# Patient Record
Sex: Female | Born: 2000 | Hispanic: No | Marital: Single | Smoking: Never smoker
Health system: Southern US, Community
[De-identification: ages and names within clinical notes are randomized; demographics above are authoritative.]

## PROBLEM LIST (undated history)

## (undated) DIAGNOSIS — E039 Hypothyroidism, unspecified: Secondary | ICD-10-CM

## (undated) DIAGNOSIS — R634 Abnormal weight loss: Secondary | ICD-10-CM

## (undated) HISTORY — PX: ARTERY AND TENDON REPAIR: SHX5696

## (undated) HISTORY — PX: MULTIPLE TOOTH EXTRACTIONS: SHX2053

## (undated) HISTORY — DX: Hypothyroidism, unspecified: E03.9

## (undated) HISTORY — DX: Abnormal weight loss: R63.4

---
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
## 2020-01-30 ENCOUNTER — Ambulatory Visit: Payer: No Typology Code available for payment source | Admitting: Family Medicine

## 2020-05-28 ENCOUNTER — Telehealth: Payer: Self-pay | Admitting: Family Medicine

## 2020-05-28 NOTE — Telephone Encounter (Signed)
Left at desk- mom notified.

## 2020-05-28 NOTE — Telephone Encounter (Signed)
Pt is requesting copy of shot record.  °

## 2020-10-06 ENCOUNTER — Ambulatory Visit: Payer: No Typology Code available for payment source | Admitting: Family Medicine

## 2020-10-20 ENCOUNTER — Other Ambulatory Visit (INDEPENDENT_AMBULATORY_CARE_PROVIDER_SITE_OTHER): Payer: BLUE CROSS/BLUE SHIELD

## 2020-10-20 ENCOUNTER — Other Ambulatory Visit: Payer: Self-pay

## 2020-10-20 DIAGNOSIS — Z111 Encounter for screening for respiratory tuberculosis: Secondary | ICD-10-CM | POA: Diagnosis not present

## 2020-10-20 NOTE — Progress Notes (Signed)
ERROR

## 2020-10-22 ENCOUNTER — Ambulatory Visit (HOSPITAL_COMMUNITY)
Admission: RE | Admit: 2020-10-22 | Discharge: 2020-10-22 | Disposition: A | Discharge status: Home / Self Care | Payer: BLUE CROSS/BLUE SHIELD | Source: Ambulatory Visit | Attending: Family Medicine | Admitting: Family Medicine

## 2020-10-22 ENCOUNTER — Other Ambulatory Visit: Payer: Self-pay

## 2020-10-22 ENCOUNTER — Other Ambulatory Visit: Payer: Self-pay | Admitting: Family Medicine

## 2020-10-22 DIAGNOSIS — R7611 Nonspecific reaction to tuberculin skin test without active tuberculosis: Secondary | ICD-10-CM | POA: Diagnosis not present

## 2020-10-22 DIAGNOSIS — Z111 Encounter for screening for respiratory tuberculosis: Secondary | ICD-10-CM | POA: Insufficient documentation

## 2020-10-22 DIAGNOSIS — Q676 Pectus excavatum: Secondary | ICD-10-CM | POA: Diagnosis not present

## 2020-10-22 LAB — TB SKIN TEST: TB Skin Test: POSITIVE

## 2020-10-23 ENCOUNTER — Other Ambulatory Visit: Payer: Self-pay | Admitting: Family Medicine

## 2020-10-23 DIAGNOSIS — Z111 Encounter for screening for respiratory tuberculosis: Secondary | ICD-10-CM | POA: Diagnosis not present

## 2020-10-25 LAB — QUANTIFERON-TB GOLD PLUS

## 2020-10-26 ENCOUNTER — Telehealth: Payer: Self-pay

## 2020-10-26 NOTE — Telephone Encounter (Signed)
Results not completed at this time. Contacted patient and pt verbalized understanding.

## 2020-10-26 NOTE — Telephone Encounter (Signed)
Patient would like results of TB lab results.

## 2020-10-27 LAB — QUANTIFERON-TB GOLD PLUS
QuantiFERON Mitogen Value: >10 IU/mL
QuantiFERON Nil Value: 0 IU/mL
QuantiFERON TB1 Ag Value: 0 IU/mL
QuantiFERON TB2 Ag Value: 0 IU/mL
QuantiFERON-TB Gold Plus: NEGATIVE

## 2021-02-24 ENCOUNTER — Telehealth: Payer: Self-pay

## 2021-02-24 NOTE — Telephone Encounter (Signed)
Need copy of shot record and copy of test results on TB after they did the chest x-Ray   Pt call back [phone]

## 2021-02-25 NOTE — Telephone Encounter (Signed)
Vaccine record and blood work up front for pick up. Patient notified.

## 2021-03-10 ENCOUNTER — Ambulatory Visit
Admission: EM | Admit: 2021-03-10 | Discharge: 2021-03-10 | Disposition: A | Discharge status: Home / Self Care | Payer: 59 | Attending: Emergency Medicine | Admitting: Emergency Medicine

## 2021-03-10 ENCOUNTER — Other Ambulatory Visit: Payer: Self-pay

## 2021-03-10 ENCOUNTER — Encounter: Payer: Self-pay | Admitting: Emergency Medicine

## 2021-03-10 DIAGNOSIS — R509 Fever, unspecified: Secondary | ICD-10-CM | POA: Diagnosis present

## 2021-03-10 DIAGNOSIS — J029 Acute pharyngitis, unspecified: Secondary | ICD-10-CM | POA: Diagnosis present

## 2021-03-10 LAB — POCT MONO SCREEN (KUC): Mono, POC: NEGATIVE

## 2021-03-10 LAB — POCT RAPID STREP A (OFFICE): Rapid Strep A Screen: NEGATIVE

## 2021-03-10 MED ORDER — IBUPROFEN 800 MG PO TABS
800.0000 mg | ORAL_TABLET | Freq: Once | ORAL | Status: AC
  Administered 2021-03-10: 800 mg via ORAL

## 2021-03-10 MED ORDER — IBUPROFEN 800 MG PO TABS: 800.0000 mg | ORAL_TABLET | Freq: Three times a day (TID) | ORAL | 0 refills | Status: DC

## 2021-03-10 NOTE — ED Provider Notes (Signed)
[facility]   [id number] 03/10/21 Arrival Time: 1711  XK:GYJE THROAT  SUBJECTIVE: History from: patient.  Wendy Lester is a 20 y.o. female who presents with abrupt onset of fever, and sore throat x few days.  Denies sick exposure to strep, flu or mono, or precipitating event.  Has tried OTC medications without relief.  Symptoms are made worse with swallowing, but tolerating liquids and own secretions without difficulty.  However, has not been drinking liquids due to discomfort.  Denies previous symptoms in the past.  Denies ear pain, sinus pain, rhinorrhea, nasal congestion, cough, SOB, wheezing, chest pain, nausea, rash, changes in bowel or bladder habits.     ROS: As per HPI.  All other pertinent ROS negative.     History reviewed. No pertinent past medical history. Past Surgical History:  Procedure Laterality Date  . ARTERY AND TENDON REPAIR Left    Radial artery repair  . ARTERY EXPLORATION Left 05/23/2016   Procedure: EXPLORATION LEFT WRIST with REPAIR OF Radial ARTERY;  Surgeon: Sherren Kerns, MD;  Location: [facility] OR;  Service: Vascular;  Laterality: Left;  Marland Kitchen MULTIPLE TOOTH EXTRACTIONS    . TENDON REPAIR Left 05/23/2016   Procedure: Flexor Carpi RadialisTENDON REPAIR;  Surgeon: Sherren Kerns, MD;  Location: [facility] OR;  Service: Vascular;  Laterality: Left;   Allergies  Allergen Reactions  . Pork-Derived Products    No current facility-administered medications on file prior to encounter.   Current Outpatient Medications on File Prior to Encounter  Medication Sig Dispense Refill  . famotidine (PEPCID) 20 MG tablet Take one tablet po every morning 30 tablet 5  . fluocinonide cream (LIDEX) 0.05 % Apply 1 application topically 2 (two) times daily as needed (eczema).    . promethazine (PHENERGAN) 25 MG tablet Take one tablet po q 8 hours prn nausea 24 tablet 2  . [DISCONTINUED] cetirizine (ZYRTEC CHILDRENS ALLERGY) 10 MG chewable tablet Chew 1 tablet (10 mg total) by  mouth at bedtime. (Patient not taking: Reported on 08/27/2019) 30 tablet 5   Social History   Socioeconomic History  . Marital status: Single    Spouse name: Not on file  . Number of children: Not on file  . Years of education: Not on file  . Highest education level: Not on file  Occupational History  . Not on file  Tobacco Use  . Smoking status: Never Smoker  . Smokeless tobacco: Never Used  Substance and Sexual Activity  . Alcohol use: Not on file  . Drug use: Not on file  . Sexual activity: Never  Other Topics Concern  . Not on file  Social History Narrative   Lives with Mom, Dad, & Brother. 2 cats, 2 baby birds. No smokers.   Social Determinants of Health   Financial Resource Strain: Not on file  Food Insecurity: Not on file  Transportation Needs: Not on file  Physical Activity: Not on file  Stress: Not on file  Social Connections: Not on file  Intimate Partner Violence: Not on file   Family History  Problem Relation Age of Onset  . Diabetes Father   . Leukemia Brother     OBJECTIVE:  Vitals:   03/10/21 1805 03/10/21 1807 03/10/21 1841  BP:  (!) 88/59 119/78  Pulse:  (!) 122 (!) 113  Resp:  18   Temp:  (!) 100.4 F (38 C)   TempSrc:  Oral   SpO2:  98%   Weight: 108 lb (49 kg)  General appearance: alert; appears fatigued, but nontoxic, speaking in full sentences and managing own secretions HEENT: NCAT; Ears: EACs clear, TMs pearly gray with visible cone of light, without erythema; Eyes: PERRL, EOMI grossly; Nose: no obvious rhinorrhea; Throat: oropharynx clear, tonsils 1+ and mildly erythematous without white tonsillar exudates, blister to soft palate; Lungs: CTA bilaterally without adventitious breath sounds; cough absent Heart: regular rate and rhythm.  Radial pulses 2+ symmetrical bilaterally Skin: warm and dry Psychological: alert and cooperative; normal mood and affect  LABS: Results for orders placed or performed during the hospital encounter  of 03/10/21 (from the past 24 hour(s))  POCT rapid strep A     Status: None   Collection Time: 03/10/21  6:14 PM  Result Value Ref Range   Rapid Strep A Screen Negative Negative  POCT mono screen     Status: None   Collection Time: 03/10/21  6:35 PM  Result Value Ref Range   Mono, POC Negative Negative     ASSESSMENT & PLAN:  1. Sore throat   2. Fever, unspecified     Meds ordered this encounter  Medications  . ibuprofen (ADVIL) tablet 800 mg  . ibuprofen (ADVIL) 800 MG tablet    Sig: Take 1 tablet (800 mg total) by mouth 3 (three) times daily.    Dispense:  21 tablet    Refill:  0    Order Specific Question:   Supervising Provider    Answer:   Eustace Moore [[id number]]   Vital signs improved with ibuprofen and po fluids Strep test negative, will send out for culture and we will call you with results Mono negative Get plenty of rest and push fluids Supplement with pedialyte Drink warm or cool liquids, use throat lozenges, or popsicles to help alleviate symptoms Ibuprofen prescribed.  Take as directed Follow up with pediatrician for recheck Return or go to ER if patient has any new or worsening symptoms such as fever, chills, nausea, vomiting, worsening sore throat, cough, abdominal pain, chest pain, changes in bowel or bladder habits, etc...  Reviewed expectations re: course of current medical issues. Questions answered. Outlined signs and symptoms indicating need for more acute intervention. Patient verbalized understanding. After Visit Summary given.        Rennis Harding, PA-C 03/10/21 [id number]

## 2021-03-10 NOTE — Discharge Instructions (Signed)
Vital signs improved with ibuprofen and po fluids Strep test negative, will send out for culture and we will call you with results Mono negative Get plenty of rest and push fluids Supplement with pedialyte Drink warm or cool liquids, use throat lozenges, or popsicles to help alleviate symptoms Ibuprofen prescribed.  Take as directed Follow up with pediatrician for recheck Return or go to ER if patient has any new or worsening symptoms such as fever, chills, nausea, vomiting, worsening sore throat, cough, abdominal pain, chest pain, changes in bowel or bladder habits, etc..Marland Kitchen

## 2021-03-10 NOTE — ED Triage Notes (Signed)
Fever, sore throat, headache, dizzy for the past few days.  Had one tylenol tablet at 3pm today.

## 2021-03-12 LAB — COVID-19, FLU A+B NAA
Influenza A, NAA: NOT DETECTED
Influenza B, NAA: NOT DETECTED
SARS-CoV-2, NAA: NOT DETECTED

## 2021-03-13 LAB — CULTURE, GROUP A STREP (THRC)

## 2021-04-20 IMAGING — DX DG CHEST 2V
2 series · 2 of 2 positions shown · non-contrast
Comparison: Chest x-ray dated October 07, 2008.

CLINICAL DATA: Positive TB skin test.  No chest complaints.

EXAM:
CHEST - 2 VIEW

[chest pa]
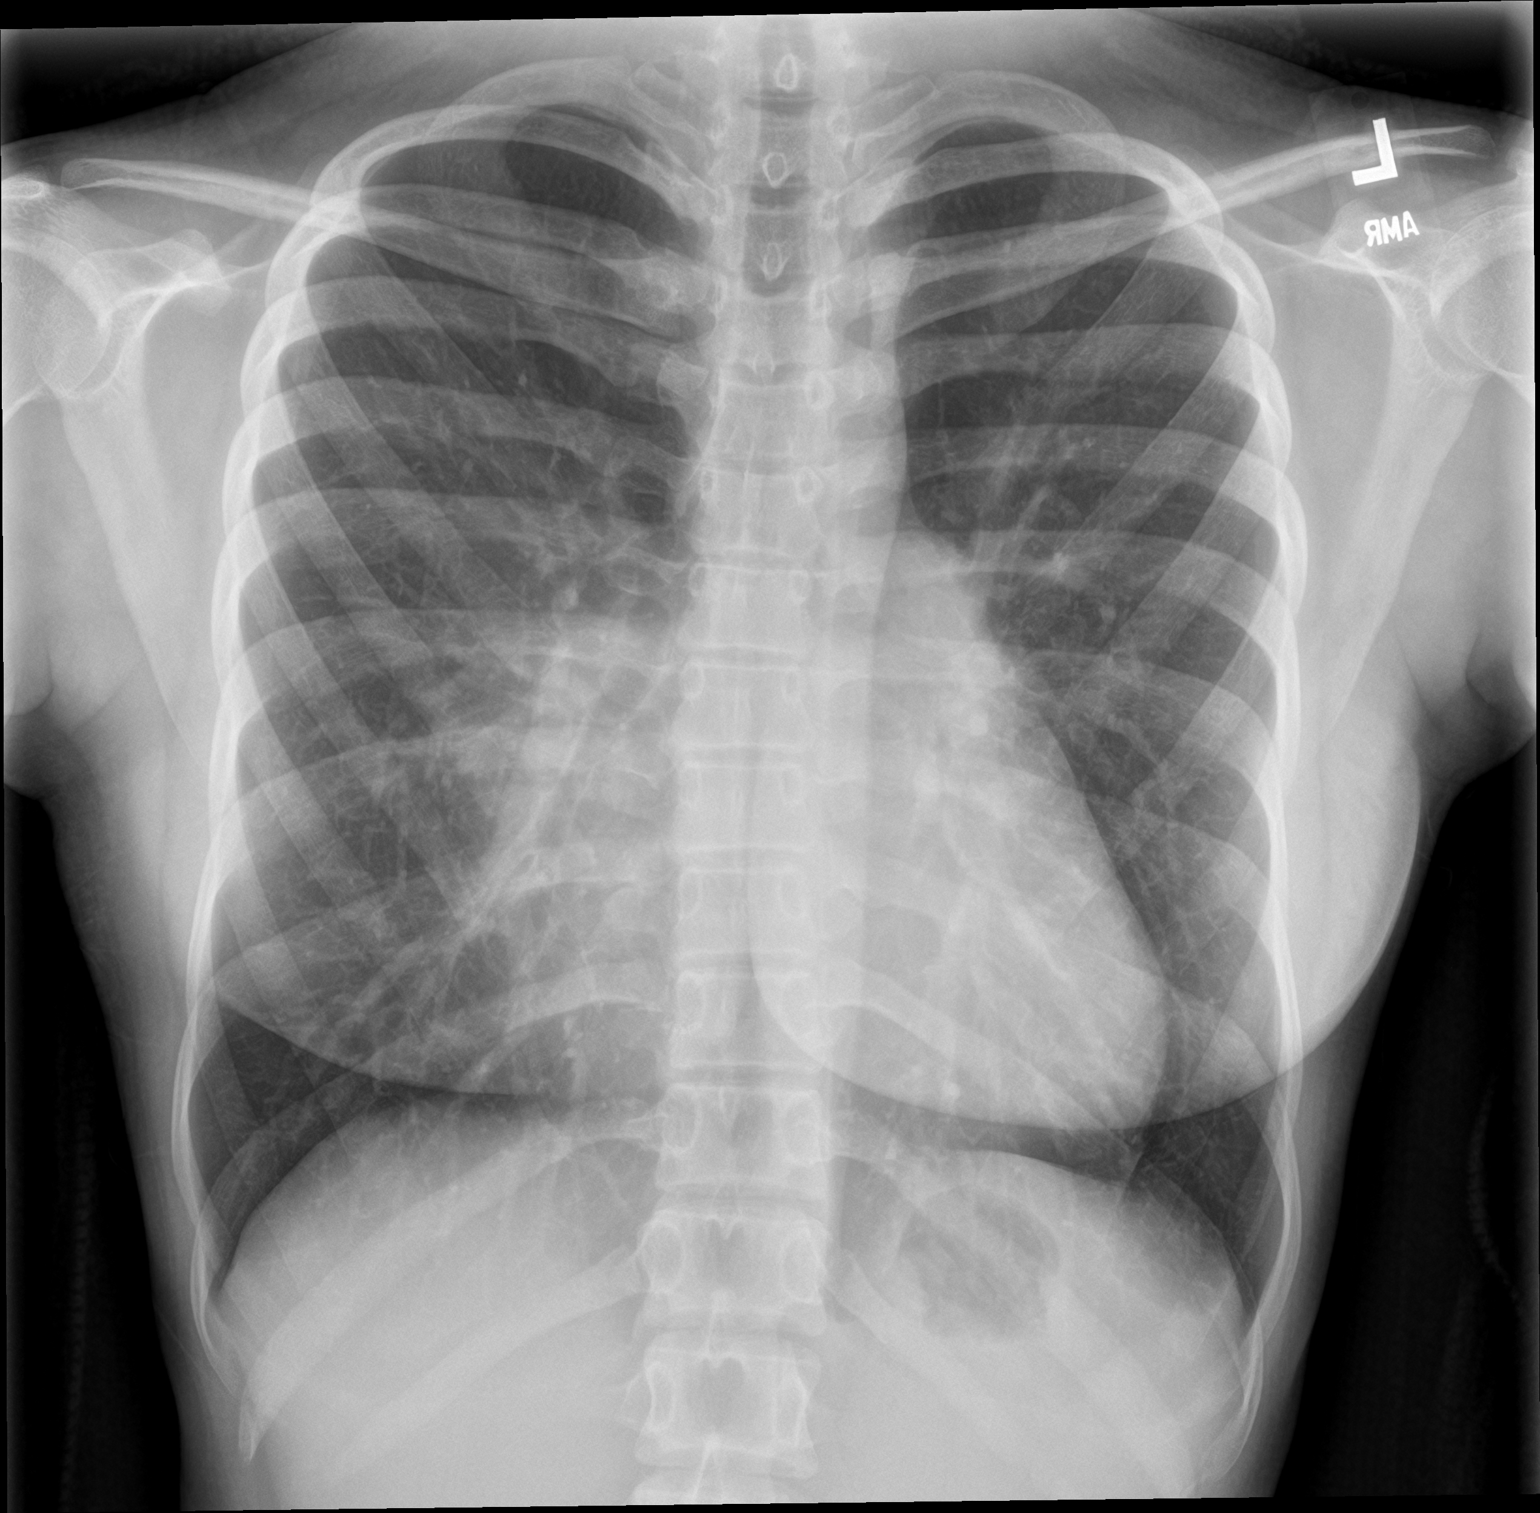

[chest lat]
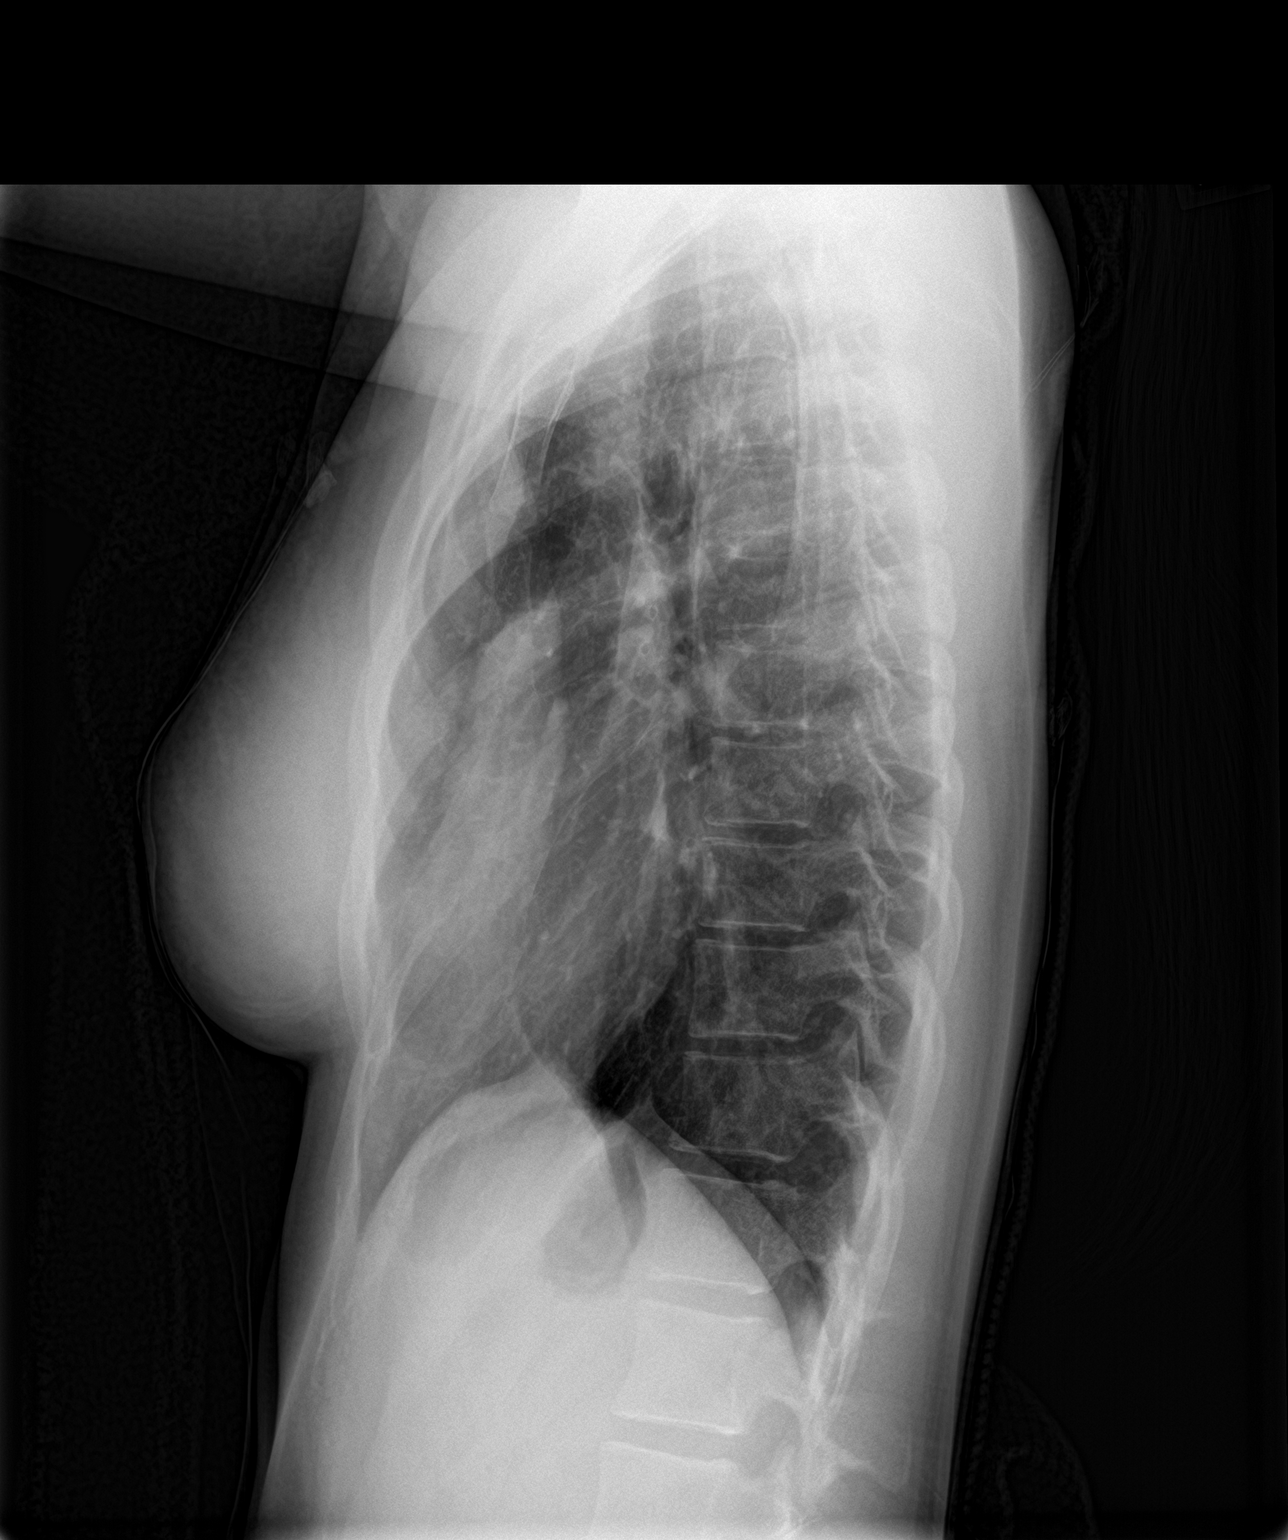

[2 of 2 positions shown; findings below may reference images not displayed]

FINDINGS: Slightly accentuated cardiac silhouette with blurring of the right
heart border related to pectus excavatum deformity. Normal pulmonary
vascularity. No focal consolidation, pleural effusion, or
pneumothorax. No acute osseous abnormality.
IMPRESSION: 1. No acute cardiopulmonary disease.
2. Pectus excavatum.

## 2021-04-21 ENCOUNTER — Telehealth: Payer: Self-pay | Admitting: Family Medicine

## 2021-04-21 NOTE — Telephone Encounter (Signed)
Patient is requesting copy of shot records and TB test results.

## 2021-04-22 NOTE — Telephone Encounter (Signed)
Patient informed shot record and tb test results are at front desk for pick up

## 2021-05-27 ENCOUNTER — Other Ambulatory Visit: Payer: Self-pay

## 2021-05-27 ENCOUNTER — Ambulatory Visit (INDEPENDENT_AMBULATORY_CARE_PROVIDER_SITE_OTHER): Payer: 59 | Admitting: Family Medicine

## 2021-05-27 ENCOUNTER — Ambulatory Visit: Payer: Self-pay | Admitting: Family Medicine

## 2021-05-27 ENCOUNTER — Encounter: Payer: Self-pay | Admitting: Family Medicine

## 2021-05-27 VITALS — BP 94/64 | HR 84 | Temp 98.1°F | Ht 64.58 in | Wt 104.0 lb

## 2021-05-27 DIAGNOSIS — R634 Abnormal weight loss: Secondary | ICD-10-CM | POA: Diagnosis not present

## 2021-05-27 DIAGNOSIS — R636 Underweight: Secondary | ICD-10-CM | POA: Diagnosis not present

## 2021-05-27 DIAGNOSIS — R5383 Other fatigue: Secondary | ICD-10-CM

## 2021-05-27 NOTE — Patient Instructions (Signed)
https://www.dietaryguidelines.gov/sites/default/files/2021-03/Dietary_Guidelines_for_Americans-2020-2025.pdf">  High-Protein and High-Calorie Diet Eating high-protein and high-calorie foods can help you to gain weight, heal after an injury, and recover after an illness or surgery. The specific amount of daily protein and calories you need depends on: Your body weight. The reason this diet is recommended for you. Generally, a high-protein, high-calorie diet involves: Eating 250-500 extra calories each day. Making sure that you get enough of your daily calories from protein. Ask your health care provider how many of your calories should come from protein. Talk with a health care provider or a dietitian about how much protein and how many calories you need each day. Follow the diet as directed by your healthcare provider. What are tips for following this plan?  Reading food labels Check the nutrition facts label for calories, grams of fat and protein. Items with more than 4 grams of protein are high-protein foods. Preparing meals Add whole milk, half-and-half, or heavy cream to cereal, pudding, soup, or hot cocoa. Add whole milk to instant breakfast drinks. Add peanut butter to oatmeal or smoothies. Add powdered milk to baked goods, smoothies, or milkshakes. Add powdered milk, cream, or butter to mashed potatoes. Add cheese to cooked vegetables. Make whole-milk yogurt parfaits. Top them with granola, fruit, or nuts. Add cottage cheese to fruit. Add avocado, cheese, or both to sandwiches or salads. Add avocado to smoothies. Add meat, poultry, or seafood to rice, pasta, casseroles, salads, and soups. Use mayonnaise when making egg salad, chicken salad, or tuna salad. Use peanut butter as a dip for fruits and vegetables or as a topping for pretzels, celery, or crackers. Add beans to casseroles, dips, and spreads. Add pureed beans to sauces and soups. Replace calorie-free drinks with  calorie-containing drinks, such as milk and fruit juice. Replace water with milk or heavy cream when making foods such as oatmeal, pudding, or cocoa. Add oil or butter to cooked vegetables and grains. Add cream cheese to sandwiches or as a topping on crackers and bread. Make cream-based pastas and soups. General information Ask your health care provider if you should take a nutritional supplement. Try to eat six small meals each day instead of three large meals. A general goal is to eat every 2 to 3 hours. Eat a balanced diet. In each meal, include one food that is high in protein and one food with fat in it. Keep nutritious snacks available, such as nuts, trail mixes, dried fruit, and yogurt. If you have kidney disease or diabetes, talk with your health care provider about how much protein is safe for you. Too much protein may put extra stress on your kidneys. Drink your calories. Choose high-calorie drinks and have them after your meals. Consider setting a timer to remind you to eat. You will want to eat even if you do not feel very hungry. What high-protein foods should I eat?  Vegetables Soybeans. Peas. Grains Quinoa. Bulgur wheat. Buckwheat. Meats and other proteins Beef, pork, and poultry. Fish and seafood. Eggs. Tofu. Textured vegetable protein (TVP). Peanut butter. Nuts and seeds. Dried beans. Protein powders.Hummus. Dairy Whole milk. Whole-milk yogurt. Powdered milk. Cheese. Cottage Cheese. Eggnog. Beverages High-protein supplement drinks. Soy milk. Other foods Protein bars. The items listed above may not be a complete list of foods and beverages you can eat and drink. Contact a dietitian for more information. What high-calorie foods should I eat? Fruits Dried fruit. Fruit leather. Canned fruit in syrup. Fruit juice. Avocado. Vegetables Vegetables cooked in oil or butter. Fried potatoes. Grains   Pasta. Quick breads. Muffins. Pancakes. Ready-to-eat cereal. Meats and other  proteins Peanut butter. Nuts and seeds. Dairy Heavy cream. Whipped cream. Cream cheese. Sour cream. Ice cream. Custard.Pudding. Whole milk dairy products. Beverages Meal-replacement beverages. Nutrition shakes. Fruit juice. Seasonings and condiments Salad dressing. Mayonnaise. Alfredo sauce. Fruit preserves or jelly. Honey.Syrup. Sweets and desserts Cake. Cookies. Pie. Pastries. Candy bars. Chocolate. Fats and oils Butter or margarine. Oil. Gravy. Other foods Meal-replacement bars. The items listed above may not be a complete list of foods and beverages you can eat and drink. Contact a dietitian for more information. Summary A high-protein, high-calorie diet can help you gain weight or heal faster after an injury, illness, or surgery. To increase your protein and calories, add ingredients such as whole milk, peanut butter, cheese, beans, meat, or seafood to meal items. To get enough extra calories each day, include high-calorie foods and beverages at each meal. Adding a high-calorie drink or shake can be an easy way to help you get enough calories each day. Talk with your healthcare provider or dietitian about the best options for you. This information is not intended to replace advice given to you by your health care provider. Make sure you discuss any questions you have with your healthcare provider. Document Revised: 10/11/2020 Document Reviewed: 10/11/2020 Elsevier Patient Education  2022 Elsevier Inc.  

## 2021-05-27 NOTE — Progress Notes (Signed)
Patient ID: Wendy Lester, female    DOB: Jul 18, 2001, 20 y.o.   MRN: [id number]   Chief Complaint  Patient presents with   appetite concerns    Mainly eats one meal per day and one snack Retest for possible tsh issue   Subjective:    HPI Pt seen for f/u for weight loss, seen in 2020 by last pcp for this.  Pt stating nothing new, but not eating a lot.  Not having an increase in weight.  Feeling should be around 125lb and bp is low and feeling tired/fatigued. Seen by last pcp in 12/20.  Gave some medication and labs and tried ot help improve the appetite. Pt never took the meds.  Tsh was borderline and asked them to f/u 60mofor recheck.  In college now.  Summer break an [employer].  Liking school.  [place] 4.7 in 12/20.  Pt stating she feels she is getting full really fast.  Feeling stomach is too full.  Eating 1 meal per day 1 snack per day.  Not feeling hungry in mornings, not eating till afternoons/dinner time.  No thyroid symptoms. No hair loss, coarse skin, diarrhea, constipation, palpations, having regular periods.  Exercising- no extra.  Wt Readings from Last 3 Encounters:  05/27/21 104 lb (47.2 kg) (7 %, Z= -1.47)*  03/10/21 108 lb (49 kg) (13 %, Z= -1.14)*  11/01/19 106 lb 3.2 oz (48.2 kg) (13 %, Z= -1.11)*   * Growth percentiles are based on CDC (Girls, 2-20 Years) data.    Medical History [place] no past medical history on file.   Outpatient Encounter Medications as of 05/27/2021  Medication Sig   [DISCONTINUED] cetirizine (ZYRTEC CHILDRENS ALLERGY) 10 MG chewable tablet Chew 1 tablet (10 mg total) by mouth at bedtime. (Patient not taking: Reported on 08/27/2019)   [DISCONTINUED] famotidine (PEPCID) 20 MG tablet Take one tablet po every morning (Patient not taking: Reported on 05/27/2021)   [DISCONTINUED] fluocinonide cream (LIDEX) 00.96% Apply 1 application topically 2 (two) times daily as needed (eczema). (Patient not  taking: Reported on 05/27/2021)   [DISCONTINUED] ibuprofen (ADVIL) 800 MG tablet Take 1 tablet (800 mg total) by mouth 3 (three) times daily. (Patient not taking: Reported on 05/27/2021)   [DISCONTINUED] promethazine (PHENERGAN) 25 MG tablet Take one tablet po q 8 hours prn nausea (Patient not taking: Reported on 05/27/2021)   No facility-administered encounter medications on file as of 05/27/2021.     Review of Systems  Constitutional:  Positive for appetite change, fatigue and unexpected weight change (weight loss). Negative for chills and fever.  HENT:  Negative for congestion, rhinorrhea and sore throat.   Respiratory:  Negative for cough, shortness of breath and wheezing.   Cardiovascular:  Negative for chest pain and leg swelling.  Gastrointestinal:  Negative for abdominal pain, diarrhea, nausea and vomiting.  Endocrine: Negative for cold intolerance, heat intolerance, polydipsia, polyphagia and polyuria.  Genitourinary:  Negative for dysuria and frequency.  Musculoskeletal:  Negative for arthralgias and back pain.  Skin:  Negative for rash.  Neurological:  Negative for dizziness, weakness and headaches.    Vitals BP 94/64   Pulse 84   Temp 98.1 F (36.7 C)   Ht 5' 4.58" (1.64 m)   Wt 104 lb (47.2 kg)   SpO2 98%   BMI 17.53 kg/m   Objective:   Physical Exam Vitals and nursing note reviewed.  Constitutional:      General: She  is not in acute distress.    Appearance: Normal appearance. She is not ill-appearing.  HENT:     Head: Normocephalic and atraumatic.     Nose: Nose normal. No congestion.     Mouth/Throat:     Mouth: Mucous membranes are moist.     Pharynx: Oropharynx is clear. No oropharyngeal exudate or posterior oropharyngeal erythema.  Eyes:     Extraocular Movements: Extraocular movements intact.     Conjunctiva/sclera: Conjunctivae normal.     Pupils: Pupils are equal, round, and reactive to light.  Cardiovascular:     Rate and Rhythm: Normal rate and regular  rhythm.     Pulses: Normal pulses.     Heart sounds: Normal heart sounds.  Pulmonary:     Effort: Pulmonary effort is normal.     Breath sounds: Normal breath sounds. No wheezing, rhonchi or rales.  Abdominal:     General: Abdomen is flat. Bowel sounds are normal. There is no distension.     Palpations: Abdomen is soft. There is no mass.     Tenderness: There is no abdominal tenderness. There is no guarding or rebound.     Hernia: No hernia is present.  Musculoskeletal:        General: Normal range of motion.     Cervical back: Normal range of motion.     Right lower leg: No edema.     Left lower leg: No edema.  Skin:    General: Skin is warm and dry.     Findings: No lesion or rash.  Neurological:     General: No focal deficit present.     Mental Status: She is alert and oriented to person, place, and time.     Cranial Nerves: No cranial nerve deficit.  Psychiatric:        Mood and Affect: Mood normal.        Behavior: Behavior normal.     Assessment and Plan   1. Other fatigue - CBC - CMP14+EGFR - Lipid panel - TSH  2. Weight loss - TSH  3. Patient underweight   Pt has BMI 17.53.  Pt denies any anxiety or stress.  No pain or nausea.  Likely not taking in enough calories with only eating 1 meal per day and 1 snack.  Gave handout on increaseing calories in diet, recommending getting at least 1200 cal per day.  And eating high calories/high fat items to help get weight up.   Results to home number.  Return in about 6 months (around 11/27/2021), or if symptoms worsen or fail to improve, for weight gain.   05/27/2021

## 2021-05-28 ENCOUNTER — Ambulatory Visit: Payer: Self-pay | Admitting: Family Medicine

## 2021-06-01 ENCOUNTER — Telehealth: Payer: Self-pay | Admitting: Family Medicine

## 2021-06-01 LAB — CMP14+EGFR
ALT: 10 IU/L (ref 0–32)
AST: 14 IU/L (ref 0–40)
Albumin/Globulin Ratio: 1.7 (ref 1.2–2.2)
Albumin: 5 g/dL (ref 3.9–5.0)
Alkaline Phosphatase: 62 IU/L (ref 42–106)
BUN/Creatinine Ratio: 12 (ref 9–23)
BUN: 9 mg/dL (ref 6–20)
Bilirubin Total: 0.3 mg/dL (ref 0.0–1.2)
CO2: 20 mmol/L (ref 20–29)
Calcium: 9.7 mg/dL (ref 8.7–10.2)
Chloride: 103 mmol/L (ref 96–106)
Creatinine, Ser: 0.75 mg/dL (ref 0.57–1.00)
Globulin, Total: 2.9 g/dL (ref 1.5–4.5)
Glucose: 88 mg/dL (ref 65–99)
Potassium: 4.5 mmol/L (ref 3.5–5.2)
Sodium: 138 mmol/L (ref 134–144)
Total Protein: 7.9 g/dL (ref 6.0–8.5)
eGFR: 118 mL/min/{1.73_m2} (ref >59)

## 2021-06-01 LAB — LIPID PANEL
Chol/HDL Ratio: 3.2 ratio (ref 0.0–4.4)
Cholesterol, Total: 175 mg/dL — ABNORMAL HIGH (ref 100–169)
HDL: 55 mg/dL (ref >39)
LDL Chol Calc (NIH): 102 mg/dL (ref 0–109)
Triglycerides: 96 mg/dL — ABNORMAL HIGH (ref 0–89)
VLDL Cholesterol Cal: 18 mg/dL (ref 5–40)

## 2021-06-01 LAB — CBC
Hematocrit: 34.4 % (ref 34.0–46.6)
Hemoglobin: 11.3 g/dL (ref 11.1–15.9)
MCH: 26.8 pg (ref 26.6–33.0)
MCHC: 32.8 g/dL (ref 31.5–35.7)
MCV: 82 fL (ref 79–97)
Platelets: 329 10*3/uL (ref 150–450)
RBC: 4.21 x10E6/uL (ref 3.77–5.28)
RDW: 14.1 % (ref 11.7–15.4)
WBC: 6.2 10*3/uL (ref 3.4–10.8)

## 2021-06-01 LAB — TSH: TSH: 4.6 u[IU]/mL — ABNORMAL HIGH (ref 0.450–4.500)

## 2021-06-02 LAB — SPECIMEN STATUS REPORT

## 2021-06-02 NOTE — Telephone Encounter (Signed)
Test added per [facility]- await results

## 2021-06-02 NOTE — Telephone Encounter (Signed)
  Pls see if lab can add on T3 and T4 (free) to the blood.  Thx. Dr. Ladona Ridgel

## 2021-06-02 NOTE — Telephone Encounter (Signed)
Laroy Apple M, DO       Thx!

## 2021-06-08 ENCOUNTER — Encounter: Payer: Self-pay | Admitting: Family Medicine

## 2021-06-08 ENCOUNTER — Other Ambulatory Visit: Payer: Self-pay | Admitting: Family Medicine

## 2021-06-08 DIAGNOSIS — R946 Abnormal results of thyroid function studies: Secondary | ICD-10-CM | POA: Insufficient documentation

## 2021-06-08 DIAGNOSIS — R5383 Other fatigue: Secondary | ICD-10-CM | POA: Insufficient documentation

## 2021-06-08 DIAGNOSIS — R634 Abnormal weight loss: Secondary | ICD-10-CM

## 2021-06-08 DIAGNOSIS — E039 Hypothyroidism, unspecified: Secondary | ICD-10-CM

## 2021-06-09 ENCOUNTER — Telehealth: Payer: Self-pay | Admitting: Family Medicine

## 2021-06-09 LAB — T4, FREE: Free T4: 1.08 ng/dL (ref 0.93–1.60)

## 2021-06-09 LAB — SPECIMEN STATUS REPORT

## 2021-06-09 LAB — T3 UPTAKE: T3 Uptake Ratio: 23 % — ABNORMAL LOW (ref 24–39)

## 2021-06-09 NOTE — Telephone Encounter (Signed)
Pt states that she got her blood work back and is wondering if she can get a call regarding the medication that was talked about in the visit. Pt voiced that Height is also 5'6 which needs to be changed

## 2021-06-09 NOTE — Telephone Encounter (Signed)
Left message to return call  No med mentioned in office note or lab Pt will need to come in for height check in order to change height.

## 2021-06-11 NOTE — Telephone Encounter (Signed)
Patient notified and verbalized understanding. Patient will follow up with endocrinology as advised

## 2021-06-13 ENCOUNTER — Telehealth: Payer: Self-pay | Admitting: Family Medicine

## 2021-06-13 MED ORDER — CYPROHEPTADINE HCL 4 MG PO TABS: 2.0000 mg | ORAL_TABLET | Freq: Three times a day (TID) | ORAL | 0 refills | Status: DC

## 2021-06-14 NOTE — Telephone Encounter (Signed)
  Mom was asking about starting medication to help stimulate her appetite, periactin.  Will give periactin take 1/2 tablet 3x per day.  And f/u in 1 month for a recheck to see if gaining weight.   Dr. Ladona Ridgel

## 2021-06-14 NOTE — Telephone Encounter (Signed)
Telephone call no answer to notify patient. 

## 2021-06-17 NOTE — Telephone Encounter (Signed)
Telephone call no answer 

## 2021-06-22 NOTE — Telephone Encounter (Signed)
Pt mother contacted and verbalized understanding. Pt set up for 07/30/21 at 2:10 for weight check.

## 2021-07-16 ENCOUNTER — Encounter: Payer: Self-pay | Admitting: Family Medicine

## 2021-07-16 ENCOUNTER — Ambulatory Visit (INDEPENDENT_AMBULATORY_CARE_PROVIDER_SITE_OTHER): Payer: 59 | Admitting: Family Medicine

## 2021-07-16 ENCOUNTER — Other Ambulatory Visit: Payer: Self-pay

## 2021-07-16 VITALS — BP 101/68 | HR 76 | Ht 64.75 in | Wt 110.2 lb

## 2021-07-16 DIAGNOSIS — R946 Abnormal results of thyroid function studies: Secondary | ICD-10-CM | POA: Diagnosis not present

## 2021-07-16 DIAGNOSIS — R636 Underweight: Secondary | ICD-10-CM

## 2021-07-16 MED ORDER — CYPROHEPTADINE HCL 4 MG PO TABS: 2.0000 mg | ORAL_TABLET | Freq: Three times a day (TID) | ORAL | 0 refills | Status: DC

## 2021-07-16 NOTE — Patient Instructions (Signed)
Call in early November to get on schedule to follow up for weight check.

## 2021-07-16 NOTE — Progress Notes (Signed)
Patient ID: Wendy Lester, female    DOB: 02/18/2001, 20 y.o.   MRN: [id number]   Chief Complaint  Patient presents with   Weight Check    Previous weight 104 lbs 05/27/21   Subjective:  Patient state she does have a better appetite with the medication  HPI Pt seen for weight check for concerns of weight loss.  Discussed last visit of increasing calories in the diet and starting periactin. Taking it 1x per day periactin, because making her dizzy and sleepy. Taking it in evening mostly. Has gained some weight since last visit 05/27/21. -6lbs.  If not having classes and driving will take it in AM also.  Has had 6 lb weight gain since starting it. Not having any other concerns anxiety, depression, stress from school.   Trying to get more foods per day.  And getting more calories per meal. Eating 2 meals per day due to classes. Big breakfast and snack between classes.  Dinner- eating a bigger dinner. Eating more carbs in diet also.  Weight percentage- 15 % now and was at 7% in July 2022.  Has appt 07/31/21 with endo for f/u on TSH 4.6 and T4- 1.08.  T3 was low at 23.   Pt continues to deny anxiety, depression or new stressors with college.  Medical History Wendy Lester has no past medical history on file.   Outpatient Encounter Medications as of 07/16/2021  Medication Sig   cyproheptadine (PERIACTIN) 4 MG tablet Take 0.5 tablets (2 mg total) by mouth 3 (three) times daily.   [DISCONTINUED] cetirizine (ZYRTEC CHILDRENS ALLERGY) 10 MG chewable tablet Chew 1 tablet (10 mg total) by mouth at bedtime. (Patient not taking: Reported on 08/27/2019)   [DISCONTINUED] cyproheptadine (PERIACTIN) 4 MG tablet Take 0.5 tablets (2 mg total) by mouth 3 (three) times daily.   No facility-administered encounter medications on file as of 07/16/2021.     Review of Systems  Constitutional:  Positive for appetite change (improved). Negative for chills and fever.  HENT:  Negative for congestion,  rhinorrhea and sore throat.   Respiratory:  Negative for cough, shortness of breath and wheezing.   Cardiovascular:  Negative for chest pain and leg swelling.  Gastrointestinal:  Negative for abdominal pain, diarrhea, nausea and vomiting.  Genitourinary:  Negative for dysuria and frequency.  Musculoskeletal:  Negative for arthralgias and back pain.  Skin:  Negative for rash.  Neurological:  Positive for dizziness. Negative for weakness and headaches.    Vitals BP 101/68   Pulse 76   Ht 5' 4.75" (1.645 m)   Wt 110 lb 3.2 oz (50 kg)   BMI 18.48 kg/m   Objective:   Physical Exam Vitals and nursing note reviewed.  Constitutional:      General: She is not in acute distress.    Appearance: Normal appearance. She is not ill-appearing.  HENT:     Head: Normocephalic and atraumatic.  Cardiovascular:     Rate and Rhythm: Normal rate and regular rhythm.     Pulses: Normal pulses.     Heart sounds: Normal heart sounds.  Pulmonary:     Effort: Pulmonary effort is normal. No respiratory distress.     Breath sounds: Normal breath sounds. No wheezing, rhonchi or rales.  Musculoskeletal:        General: Normal range of motion.     Right lower leg: No edema.     Left lower leg: No edema.  Skin:    General: Skin is warm and dry.  Findings: No lesion or rash.  Neurological:     General: No focal deficit present.     Mental Status: She is alert and oriented to person, place, and time.     Cranial Nerves: No cranial nerve deficit.  Psychiatric:        Mood and Affect: Mood normal.        Behavior: Behavior normal.        Thought Content: Thought content normal.        Judgment: Judgment normal.     Assessment and Plan   1. Patient underweight  2. Abnormal thyroid function test   Advised if she can tolerate it she can take 1 tab p.o. qhs and 1/2 tab in am to help with her appetite.  Has had 6-7 lbs weight gain since starting the meds.  Having some drowsiness and dizziness when  taking meds. But trying to take it more at night.  Cont to increase protein and calories in her diet and try to eat 3 meals per day.  F/u 2 months for weight check and with Endo in 07/31/21 for the thyroid concerns.  Pt in agreement.   Return in about 2 months (around 09/30/2021) for f/u weight gain and thyroid.

## 2021-07-28 ENCOUNTER — Ambulatory Visit: Payer: Self-pay | Admitting: Family Medicine

## 2021-07-30 ENCOUNTER — Encounter: Payer: Self-pay | Admitting: "Endocrinology

## 2021-07-30 ENCOUNTER — Ambulatory Visit: Payer: Self-pay | Admitting: Family Medicine

## 2021-07-30 ENCOUNTER — Ambulatory Visit (INDEPENDENT_AMBULATORY_CARE_PROVIDER_SITE_OTHER): Payer: 59 | Admitting: "Endocrinology

## 2021-07-30 VITALS — BP 80/58 | HR 76 | Ht 65.0 in | Wt 110.0 lb

## 2021-07-30 DIAGNOSIS — E038 Other specified hypothyroidism: Secondary | ICD-10-CM

## 2021-07-30 DIAGNOSIS — I959 Hypotension, unspecified: Secondary | ICD-10-CM | POA: Diagnosis not present

## 2021-07-30 NOTE — Progress Notes (Signed)
Endocrinology Consult Note                                            07/30/2021, 12:12 PM   Subjective:    Patient ID: Wendy Lester, female    DOB: 2001/07/01, PCP Erven Colla, DO   Past Medical History:  Diagnosis Date   Hypothyroidism    Weight loss    Past Surgical History:  Procedure Laterality Date   ARTERY AND TENDON REPAIR Left    Radial artery repair   ARTERY EXPLORATION Left 05/23/2016   Procedure: EXPLORATION LEFT WRIST with REPAIR OF Radial ARTERY;  Surgeon: Elam Dutch, MD;  Location: [facility] OR;  Service: Vascular;  Laterality: Left;   MULTIPLE TOOTH EXTRACTIONS     TENDON REPAIR Left 05/23/2016   Procedure: [place];  Surgeon: Elam Dutch, MD;  Location: [facility] OR;  Service: Vascular;  Laterality: Left;   Social History   Socioeconomic History   Marital status: Single    Spouse name: Not on file   Number of children: Not on file   Years of education: Not on file   Highest education level: Not on file  Occupational History   Not on file  Tobacco Use   Smoking status: Never   Smokeless tobacco: Never  Vaping Use   Vaping Use: Never used  Substance and Sexual Activity   Alcohol use: Never   Drug use: Never   Sexual activity: Never  Other Topics Concern   Not on file  Social History Narrative   Lives with Mom, Dad, & Brother. 2 cats, 2 baby birds. No smokers.   Social Determinants of Health   Financial Resource Strain: Not on file  Food Insecurity: Not on file  Transportation Needs: Not on file  Physical Activity: Not on file  Stress: Not on file  Social Connections: Not on file   Family History  Problem Relation Age of Onset   Hypertension Mother    Hyperlipidemia Mother    Hyperlipidemia Father    Hypertension Father    Diabetes Father    Leukemia Brother    Outpatient Encounter Medications as of 07/30/2021  Medication Sig   cyproheptadine (PERIACTIN) 4 MG tablet Take 0.5 tablets (2 mg total) by  mouth 3 (three) times daily.   [DISCONTINUED] cetirizine (ZYRTEC CHILDRENS ALLERGY) 10 MG chewable tablet Chew 1 tablet (10 mg total) by mouth at bedtime. (Patient not taking: Reported on 08/27/2019)   No facility-administered encounter medications on file as of 07/30/2021.   ALLERGIES: Allergies  Allergen Reactions   Pork-Derived Products     VACCINATION STATUS: Immunization History  Administered Date(s) Administered   DTaP 12/19/2001, 02/02/2002, 03/20/2002, 05/05/2003, 04/02/2008   Hepatitis A 04/02/2008   Hepatitis A, Ped/Adol-2 Dose 08/07/2013   Hepatitis B 12/19/2001, 02/02/2002, 03/20/2002, 05/05/2003   HiB (PRP-OMP) 12/19/2001, 02/02/2002, 05/05/2003   IPV 12/19/2001, 02/02/2002, 03/20/2002, 05/05/2003   Influenza Split 10/03/2013   Influenza,inj,Quad PF,6+ Mos 08/17/2019   MMR 02/02/2003, 04/02/2008   Meningococcal Conjugate 08/07/2013, 08/27/2019   Moderna Sars-Covid-2 Vaccination 06/23/2020   PPD Test 10/20/2020   Tdap 08/07/2013, 05/23/2016    HPI Wendy Lester is 20 y.o. female who presents today with a medical history as above. she is being seen in consultation for subclinical hypothyroidism, weight loss requested by Erven Colla, DO.  History is  obtained directly from the patient and by chart review. She does not have acute complaints today, but she reports that of late her weight has been fluctuating, she wants to gain some weight. She admits that her dietary habits are not the best, and has a room to improve. She admits that she has always been light build , has never been overweight or obese. Review of her EMR shows her wight progressively increased from 97 lbs  in July 2017 to 110 lbs today. Her TSH has been slightly higher than target on 2 separate occasions ( December 2020 4.7, July 2022 4.6) , has had normal free T4 1.08. She denies any family hx of thyroid , adrenal dysfunction. She was found to have low BP of 80/58 mmHg associated with intermittent  dizziness , lightheadedness. She denies any prior exposure to steroids. She reports normal menstrual cycle.    Review of Systems  Constitutional: + minimal weight fluctuation, no fatigue, no subjective hyperthermia, no subjective hypothermia Eyes: no blurry vision, no xerophthalmia ENT: no sore throat, no nodules palpated in throat, no dysphagia/odynophagia, no hoarseness Cardiovascular: no Chest Pain, no Shortness of Breath, no palpitations, no leg swelling, + Intermittent lightheadedness Respiratory: no cough, no shortness of breath Gastrointestinal: no Nausea/Vomiting/Diarhhea Musculoskeletal: no muscle/joint aches Skin: no rashes Neurological: no tremors, no numbness, no tingling, no dizziness Psychiatric: no depression, no anxiety  Objective:    Vitals with BMI 07/30/2021 07/16/2021 05/27/2021  Height '5\' 5"'  5' 4.75" 5' 4.58"  Weight 110 lbs 110 lbs 3 oz 104 lbs  BMI 18.31 79.02 40.97  Systolic 80 353 94  Diastolic 58 68 64  Pulse 76 76 84    BP (!) 80/58   Pulse 76   Ht '5\' 5"'  (1.651 m)   Wt 110 lb (49.9 kg)   BMI 18.30 kg/m   Wt Readings from Last 3 Encounters:  07/30/21 110 lb (49.9 kg) (15 %, Z= -1.03)*  07/16/21 110 lb 3.2 oz (50 kg) (16 %, Z= -1.01)*  05/27/21 104 lb (47.2 kg) (7 %, Z= -1.47)*   * Growth percentiles are based on CDC (Girls, 2-20 Years) data.    Physical Exam  Constitutional:  Body mass index is 18.3 kg/m.,  not in acute distress, normal state of mind Eyes: PERRLA, EOMI, no exophthalmos ENT: moist mucous membranes, no gross thyromegaly, no gross cervical lymphadenopathy Cardiovascular: normal precordial activity, Regular Rate and Rhythm, no Murmur/Rubs/Gallops Respiratory:  adequate breathing efforts, no gross chest deformity, Clear to auscultation bilaterally Gastrointestinal: abdomen soft, Non -tender, No distension, Bowel Sounds present, no gross organomegaly Musculoskeletal: no gross deformities, strength intact in all four extremities Skin:  moist, warm, no rashes Neurological: no tremor with outstretched hands, Deep tendon reflexes normal in bilateral lower extremities.  CMP ( most recent) CMP     Component Value Date/Time   NA 138 05/31/2021 1609   K 4.5 05/31/2021 1609   CL 103 05/31/2021 1609   CO2 20 05/31/2021 1609   GLUCOSE 88 05/31/2021 1609   BUN 9 05/31/2021 1609   CREATININE 0.75 05/31/2021 1609   CALCIUM 9.7 05/31/2021 1609   PROT 7.9 05/31/2021 1609   ALBUMIN 5.0 05/31/2021 1609   AST 14 05/31/2021 1609   ALT 10 05/31/2021 1609   ALKPHOS 62 05/31/2021 1609   BILITOT 0.3 05/31/2021 1609   GFRNONAA CANCELED 10/30/2019 1449   GFRAA CANCELED 10/30/2019 1449      Lipid Panel ( most recent) Lipid Panel     Component Value Date/Time  CHOL 175 (H) 05/31/2021 1609   TRIG 96 (H) 05/31/2021 1609   HDL 55 05/31/2021 1609   CHOLHDL 3.2 05/31/2021 1609   LDLCALC 102 05/31/2021 1609   LABVLDL 18 05/31/2021 1609      Lab Results  Component Value Date   TSH 4.600 (H) 05/31/2021   TSH 4.710 (H) 10/30/2019   FREET4 1.08 05/31/2021           Assessment & Plan:   1. Subclinical hypothyroidism 2. Hypotension, unspecified hypotension type   - Leatrice Parilla  is being seen at a kind request of Elvia Collum M, DO. - I have reviewed her available endocrine records and clinically evaluated the patient. - Based on these reviews, she has subclinical hypothyroidism, unspecified hypotension, mild dyslipidemia.  however,  there is not sufficient information to proceed with definitive treatment plan.  Her weight fluctuation is minimal and largely related to her dietary habits.  Review of her medical records indicates normal weight progress over the last 6 years gaining from 82 pounds in 2016 to 110 pounds today. - she acknowledges that there is a room for improvement in her food and drink choices. - Suggestion is made for her to avoid simple carbohydrates  from her diet including Cakes, Sweet Desserts, Ice  Cream, Soda (diet and regular), Sweet Tea, Candies, Chips, Cookies, Store Bought Juices, Alcohol in Excess of  1-2 drinks a day, Artificial Sweeteners,  Coffee Creamer, and "Sugar-free" Products, Lemonade.  She is encouraged to increase her intake of healthier carbs, more proteins including lean meats as well as plant-based proteins.   She is advised to eat at least 3 meals regularly and 1-2 snacks with healthier items.  She is a Electronics engineer needing more caloric supplies.  Caloric restrictions is not advisable for her at this time.  She will be assessed for adrenal sufficiency with a.m. cortisol along with repeat full set of thyroid function tests.  If her thyroid function test indicates clinically significant abnormality, she will be assessed for intervention. She has hypotension with minimal symptoms ruling out adrenal insufficiency as important if so she does not have typical symptoms of adrenal dysfunction. She will also be assessed for vitamin D deficiency. Her one-time mild lipid abnormalities are likely not indicative of dyslipidemia.  She will not need intervention.  Adjustment in her diet with less fatty meats and dairy will likely correct it.  She will be considered for fasting lipid panel on subsequent visits.  - I did not initiate any new prescriptions today. - she is advised to maintain close follow up with Erven Colla, DO for primary care needs.   - Time spent with the patient: 60 minutes, of which >50% was spent in  counseling her about her subclinical hypothyroidism, hypertension, lipid abnormality, weight management and the rest in obtaining information about her symptoms, reviewing her previous labs/studies ( including abstractions from other facilities),  evaluations, and treatments,  and developing a plan to confirm diagnosis and long term treatment based on the latest standards of care/guidelines; and documenting her care.  Wendy Lester participated in the discussions,  expressed understanding, and voiced agreement with the above plans.  All questions were answered to her satisfaction. she is encouraged to contact clinic should she have any questions or concerns prior to her return visit.  Follow up plan: Return in about 1 week (around 08/06/2021) for Fasting Labs  in AM B4 8.   Glade Lloyd, MD [facility] [address]  [address] [place], [place] [address] Phone: [phone]  Fax: [phone]     07/30/2021, 12:12 PM  This note was partially dictated with voice recognition software. Similar sounding words can be transcribed inadequately or may not  be corrected upon review.

## 2021-08-06 ENCOUNTER — Ambulatory Visit: Payer: 59 | Admitting: "Endocrinology

## 2021-08-17 ENCOUNTER — Ambulatory Visit: Payer: 59 | Admitting: "Endocrinology

## 2022-03-18 ENCOUNTER — Ambulatory Visit: Payer: Self-pay | Admitting: Family Medicine

## 2022-03-24 ENCOUNTER — Ambulatory Visit: Payer: Self-pay | Admitting: Family Medicine

## 2022-03-24 ENCOUNTER — Encounter: Payer: Self-pay | Admitting: Family Medicine

## 2022-03-29 ENCOUNTER — Ambulatory Visit: Payer: Self-pay | Admitting: Family Medicine

## 2022-03-29 ENCOUNTER — Telehealth: Payer: Self-pay | Admitting: *Deleted

## 2022-03-29 VITALS — BP 89/56 | HR 64 | Ht 65.0 in | Wt 108.8 lb

## 2022-03-29 DIAGNOSIS — I9589 Other hypotension: Secondary | ICD-10-CM

## 2022-03-29 DIAGNOSIS — Z7184 Encounter for health counseling related to travel: Secondary | ICD-10-CM | POA: Diagnosis not present

## 2022-03-29 DIAGNOSIS — E038 Other specified hypothyroidism: Secondary | ICD-10-CM

## 2022-03-29 DIAGNOSIS — R636 Underweight: Secondary | ICD-10-CM

## 2022-03-29 DIAGNOSIS — I959 Hypotension, unspecified: Secondary | ICD-10-CM | POA: Insufficient documentation

## 2022-03-29 MED ORDER — MIRTAZAPINE 7.5 MG PO TABS: 7.5000 mg | ORAL_TABLET | Freq: Every day | ORAL | 0 refills | Status: DC

## 2022-03-29 NOTE — Telephone Encounter (Signed)
Please inform patient and mother that after looking into it, I would recommend they contact and make and appt with our travel clinic in [place] (regarding malaria prophylaxis and typhoid vaccine).  ? ? ?[facility]  ?[phone]  ? ?[address]  ?[address]  ?[place], [place] [address]  ?

## 2022-03-29 NOTE — Telephone Encounter (Signed)
Patient notified and given phone number and address to travel clinic ?

## 2022-03-29 NOTE — Assessment & Plan Note (Signed)
This is likely the patient's baseline.  I am slightly concerned given the fact that she is underweight.   ?Advised to stay well-hydrated.  Advised to increase p.o. intake and increase salt intake.  We will continue to monitor closely.  Labs today. ?

## 2022-03-29 NOTE — Assessment & Plan Note (Signed)
Recommended frequent small meals with snacks in between. ?After discussion with the patient and mother, will try Remeron off label to help with appetite and weight gain. ?

## 2022-03-29 NOTE — Patient Instructions (Signed)
Labs ordered. ? ?Medication as prescribed. ? ?We will call regarding recommendations for travel. ? ?Dr. Adriana Simas  ?

## 2022-03-29 NOTE — Progress Notes (Signed)
? ?Subjective:  ?Patient ID: Wendy Lester, female    DOB: Apr 15, 2001  Age: 21 y.o. MRN: [id number] ? ?CC: ?Chief Complaint  ?Patient presents with  ? Hypotension  ?  Mother concerned about patient's blood pressure being so low ?Patient would like to have evaluation for ADHD  ? ? ?HPI: ? ?21 year old female presents for evaluation of the above. ? ?Patient has had ongoing issues with low blood pressure.  This has been documented on multiple occasions.  She has occasional dizziness/lightheadedness but is otherwise asymptomatic.  She is underweight and does not eat a significant caloric intake.  She essentially snacks throughout the day predominantly.  Mother is concerned about this.  Patient does state that she stays well-hydrated and drinks water.  Patient is active but does not exercise multiple days a week.  No reports of restriction. ? ?Additionally, patient reports that she is concerned that she has ADHD.  She states that she is in college at [employer].  She makes fairly good grades mostly A's and B's.  However she states that she is easily distracted and seems to have some inattention.  No significant hyperactivity. ? ?Lastly, patient will be traveling to [place] later this month on the 18th.  Mother is curious about whether she needs malaria prophylaxis or travel vaccines. ? ?Patient Active Problem List  ? Diagnosis Date Noted  ? Travel advice encounter 03/29/2022  ? Arterial hypotension 03/29/2022  ? Subclinical hypothyroidism 07/30/2021  ? Patient underweight 05/27/2021  ? Mild exercise-induced asthma 07/29/2015  ? ? ?Social Hx   ?Social History  ? ?Socioeconomic History  ? Marital status: Single  ?  Spouse name: Not on file  ? Number of children: Not on file  ? Years of education: Not on file  ? Highest education level: Not on file  ?Occupational History  ? Not on file  ?Tobacco Use  ? Smoking status: Never  ? Smokeless tobacco: Never  ?Vaping Use  ? Vaping Use: Never used  ?Substance and Sexual Activity  ?  Alcohol use: Never  ? Drug use: Never  ? Sexual activity: Never  ?Other Topics Concern  ? Not on file  ?Social History Narrative  ? Lives with Mom, Dad, & Brother. 2 cats, 2 baby birds. No smokers.  ? ?Social Determinants of Health  ? ?Financial Resource Strain: Not on file  ?Food Insecurity: Not on file  ?Transportation Needs: Not on file  ?Physical Activity: Not on file  ?Stress: Not on file  ?Social Connections: Not on file  ? ? ?Review of Systems ?Per HPI ? ?Objective:  ?BP (!) 89/56   Pulse 64   Ht _0  (1.651 m)   Wt 108 lb 12.8 oz (49.4 kg)   BMI 18.11 kg/m?  ? ? ?  03/29/2022  ? 10:28 AM 07/30/2021  ?  9:01 AM 07/16/2021  ? 10:54 AM  ?BP/Weight  ?Systolic BP 89 80 211  ?Diastolic BP 56 58 68  ?Wt. (Lbs) 108.8 110 110.2  ?BMI 18.11 kg/m2 18.3 kg/m2 18.48 kg/m2  ? ? ?Physical Exam ?Vitals and nursing note reviewed.  ?Constitutional:   ?   General: She is not in acute distress. ?   Appearance: Normal appearance.  ?HENT:  ?   Head: Normocephalic and atraumatic.  ?Cardiovascular:  ?   Rate and Rhythm: Normal rate and regular rhythm.  ?Pulmonary:  ?   Effort: Pulmonary effort is normal.  ?   Breath sounds: Normal breath sounds. No wheezing, rhonchi or rales.  ?Abdominal:  ?  General: There is no distension.  ?   Palpations: Abdomen is soft.  ?   Tenderness: There is no abdominal tenderness.  ?Neurological:  ?   Mental Status: She is alert.  ?Psychiatric:     ?   Mood and Affect: Mood normal.     ?   Behavior: Behavior normal.  ? ? ?Lab Results  ?Component Value Date  ? WBC 6.2 05/31/2021  ? HGB 11.3 05/31/2021  ? HCT 34.4 05/31/2021  ? PLT 329 05/31/2021  ? GLUCOSE 88 05/31/2021  ? CHOL 175 (H) 05/31/2021  ? TRIG 96 (H) 05/31/2021  ? HDL 55 05/31/2021  ? [place] 102 05/31/2021  ? ALT 10 05/31/2021  ? AST 14 05/31/2021  ? NA 138 05/31/2021  ? K 4.5 05/31/2021  ? CL 103 05/31/2021  ? CREATININE 0.75 05/31/2021  ? BUN 9 05/31/2021  ? CO2 20 05/31/2021  ? TSH 4.600 (H) 05/31/2021  ? ? ? ?Assessment & Plan:   ? ?Problem List Items Addressed This Visit   ? ?  ? Cardiovascular and Mediastinum  ? Arterial hypotension  ?  Chronic issue.  Advised to stay hydrated.  Increase salt intake. ? ?  ?  ? Relevant Orders  ? CMP14+EGFR  ? RESOLVED: Hypotension  ? Relevant Orders  ? CMP14+EGFR  ?  ? Endocrine  ? Subclinical hypothyroidism  ? Relevant Orders  ? TSH  ?  ? Other  ? Patient underweight - Primary  ?  Recommended frequent small meals with snacks in between. ?After discussion with the patient and mother, will try Remeron off label to help with appetite and weight gain. ? ?  ?  ? Relevant Orders  ? CBC  ? Travel advice encounter  ?  Per CDC, patient will likely need typhoid vaccine and malaria prophylaxis.  We do not have typhoid vaccine available here.  Deferring to our travel medicine clinic in [place]. ? ?  ?  ? ? ?Meds ordered this encounter  ?Medications  ? mirtazapine (REMERON) 7.5 MG tablet  ?  Sig: Take 1 tablet (7.5 mg total) by mouth at bedtime.  ?  Dispense:  90 tablet  ?  Refill:  0  ? ?Thersa Salt DO ?[place] ? ?

## 2022-03-29 NOTE — Assessment & Plan Note (Signed)
Chronic issue.  Advised to stay hydrated.  Increase salt intake. ?

## 2022-03-29 NOTE — Assessment & Plan Note (Signed)
Per CDC, patient will likely need typhoid vaccine and malaria prophylaxis.  We do not have typhoid vaccine available here.  Deferring to our travel medicine clinic in [place]. ?

## 2022-03-29 NOTE — Assessment & Plan Note (Signed)
This is a chronic issue.  Advised to increase salt intake and to stay hydrated.  Labs today. ?

## 2022-04-29 ENCOUNTER — Other Ambulatory Visit (HOSPITAL_COMMUNITY)
Admission: RE | Admit: 2022-04-29 | Discharge: 2022-04-29 | Disposition: A | Discharge status: Home / Self Care | Payer: BC Managed Care – PPO | Source: Ambulatory Visit | Attending: Family Medicine | Admitting: Family Medicine

## 2022-04-29 ENCOUNTER — Ambulatory Visit (INDEPENDENT_AMBULATORY_CARE_PROVIDER_SITE_OTHER): Payer: BC Managed Care – PPO | Admitting: Family Medicine

## 2022-04-29 VITALS — BP 96/66 | HR 85 | Temp 97.9°F | Ht 65.0 in | Wt 107.0 lb

## 2022-04-29 DIAGNOSIS — R197 Diarrhea, unspecified: Secondary | ICD-10-CM | POA: Insufficient documentation

## 2022-04-29 DIAGNOSIS — R112 Nausea with vomiting, unspecified: Secondary | ICD-10-CM | POA: Insufficient documentation

## 2022-04-29 LAB — CBC
HCT: 36 % (ref 36.0–46.0)
Hemoglobin: 11.5 g/dL — ABNORMAL LOW (ref 12.0–15.0)
MCH: 25.7 pg — ABNORMAL LOW (ref 26.0–34.0)
MCHC: 31.9 g/dL (ref 30.0–36.0)
MCV: 80.4 fL (ref 80.0–100.0)
Platelets: 351 10*3/uL (ref 150–400)
RBC: 4.48 MIL/uL (ref 3.87–5.11)
RDW: 14.7 % (ref 11.5–15.5)
WBC: 6.5 10*3/uL (ref 4.0–10.5)
nRBC: 0 % (ref 0.0–0.2)

## 2022-04-29 LAB — COMPREHENSIVE METABOLIC PANEL
ALT: 11 U/L (ref 0–44)
AST: 13 U/L — ABNORMAL LOW (ref 15–41)
Albumin: 4.1 g/dL (ref 3.5–5.0)
Alkaline Phosphatase: 57 U/L (ref 38–126)
Anion gap: 7 (ref 5–15)
BUN: 8 mg/dL (ref 6–20)
CO2: 22 mmol/L (ref 22–32)
Calcium: 9.4 mg/dL (ref 8.9–10.3)
Chloride: 108 mmol/L (ref 98–111)
Creatinine, Ser: 0.61 mg/dL (ref 0.44–1.00)
GFR, Estimated: >60 mL/min (ref >60)
Glucose, Bld: 102 mg/dL — ABNORMAL HIGH (ref 70–99)
Potassium: 3.7 mmol/L (ref 3.5–5.1)
Sodium: 137 mmol/L (ref 135–145)
Total Bilirubin: 0.4 mg/dL (ref 0.3–1.2)
Total Protein: 7.8 g/dL (ref 6.5–8.1)

## 2022-04-29 LAB — LIPASE, BLOOD: Lipase: 96 U/L — ABNORMAL HIGH (ref 11–51)

## 2022-04-29 MED ORDER — PANTOPRAZOLE SODIUM 40 MG PO TBEC: 40.0000 mg | DELAYED_RELEASE_TABLET | Freq: Every day | ORAL | 0 refills | Status: DC

## 2022-04-29 NOTE — Patient Instructions (Signed)
Lots of fluids - Gatorade/Powerade.  Labs at the hospital.  Stool sample to [facility].   We will with results.  Take care  Dr. Lacinda Axon

## 2022-04-30 DIAGNOSIS — R112 Nausea with vomiting, unspecified: Secondary | ICD-10-CM | POA: Insufficient documentation

## 2022-04-30 NOTE — Assessment & Plan Note (Signed)
Likely infectious.  STAT Labs today revealed normal electrolytes, mildly elevated lipase, mild anemia.  Awaiting Stool testing. Advised hydration, rest. Protonix as prescribed.

## 2022-04-30 NOTE — Progress Notes (Signed)
Subjective:  Patient ID: Wendy Lester, female    DOB: Mar 09, 2001  Age: 21 y.o. MRN: [id number]  CC: Nausea, vomiting, diarrhea  HPI:  21 year old female presents for evaluation of the above.  Recent travel to [place]. Developed "food poisoning" while there and was treated at local hospital. Was given IV fluids and discharged on Cipro. Patient reports that she took 3 days of the medicine and stopped. She reports that she seem to be feeling better. However, symptoms returned. Arrived back in the [place] earlier this week. Nausea and vomiting has stopped but diarrhea continues. Has felt weak with fast heart rate. These symptoms are worse with activity. She is trying to stay hydrated.    Patient Active Problem List   Diagnosis Date Noted   Nausea vomiting and diarrhea 04/30/2022   Arterial hypotension 03/29/2022   Subclinical hypothyroidism 07/30/2021   Patient underweight 05/27/2021   Mild exercise-induced asthma 07/29/2015    Social Hx   Social History   Socioeconomic History   Marital status: Single    Spouse name: Not on file   Number of children: Not on file   Years of education: Not on file   Highest education level: Not on file  Occupational History   Not on file  Tobacco Use   Smoking status: Never   Smokeless tobacco: Never  Vaping Use   Vaping Use: Never used  Substance and Sexual Activity   Alcohol use: Never   Drug use: Never   Sexual activity: Never  Other Topics Concern   Not on file  Social History Narrative   Lives with Mom, Dad, & Brother. 2 cats, 2 baby birds. No smokers.   Social Determinants of Health   Financial Resource Strain: Not on file  Food Insecurity: Not on file  Transportation Needs: Not on file  Physical Activity: Not on file  Stress: Not on file  Social Connections: Not on file    Review of Systems Per HPI  Objective:  BP 96/66   Pulse 85   Temp 97.9 F (36.6 C)   Ht 5\' 5"  (1.651 m)   Wt 107 lb (48.5 kg)   SpO2 97%   BMI  17.81 kg/m      04/29/2022    2:55 PM 03/29/2022   10:28 AM 07/30/2021    9:01 AM  BP/Weight  Systolic BP 96 89 80  Diastolic BP 66 56 58  Wt. (Lbs) 107 108.8 110  BMI 17.81 kg/m2 18.11 kg/m2 18.3 kg/m2    Physical Exam Constitutional:      General: She is not in acute distress. HENT:     Head: Normocephalic.  Eyes:     General:        Right eye: No discharge.        Left eye: No discharge.     Conjunctiva/sclera: Conjunctivae normal.  Cardiovascular:     Rate and Rhythm: Normal rate and regular rhythm.  Pulmonary:     Effort: Pulmonary effort is normal.     Breath sounds: Normal breath sounds.  Abdominal:     General: There is no distension.     Palpations: Abdomen is soft.     Comments: Mild epigastric tenderness.   Neurological:     Mental Status: She is alert.  Psychiatric:        Mood and Affect: Mood normal.        Behavior: Behavior normal.     Lab Results  Component Value Date   WBC  6.5 04/29/2022   HGB 11.5 (L) 04/29/2022   HCT 36.0 04/29/2022   PLT 351 04/29/2022   GLUCOSE 102 (H) 04/29/2022   CHOL 175 (H) 05/31/2021   TRIG 96 (H) 05/31/2021   HDL 55 05/31/2021   LDLCALC 102 05/31/2021   ALT 11 04/29/2022   AST 13 (L) 04/29/2022   NA 137 04/29/2022   K 3.7 04/29/2022   CL 108 04/29/2022   CREATININE 0.61 04/29/2022   BUN 8 04/29/2022   CO2 22 04/29/2022   TSH 4.600 (H) 05/31/2021     Assessment & Plan:   Problem List Items Addressed This Visit       Digestive   Nausea vomiting and diarrhea - Primary    Likely infectious.  STAT Labs today revealed normal electrolytes, mildly elevated lipase, mild anemia.  Awaiting Stool testing. Advised hydration, rest. Protonix as prescribed.       Relevant Orders   CBC   Comprehensive metabolic panel   Lipase   Gastrointestinal Panel by PCR , Stool    Meds ordered this encounter  Medications   pantoprazole (PROTONIX) 40 MG tablet    Sig: Take 1 tablet (40 mg total) by mouth daily.     Dispense:  30 tablet    Refill:  0    Bowden Boody DO [facility]

## 2022-05-02 ENCOUNTER — Other Ambulatory Visit: Payer: Self-pay | Admitting: Family Medicine

## 2022-05-02 DIAGNOSIS — R112 Nausea with vomiting, unspecified: Secondary | ICD-10-CM

## 2022-05-05 LAB — GI PROFILE, STOOL, PCR
Adenovirus F 40/41: NOT DETECTED
Astrovirus: NOT DETECTED
C difficile toxin A/B: NOT DETECTED
Campylobacter: DETECTED — AB
Cryptosporidium: NOT DETECTED
Cyclospora cayetanensis: NOT DETECTED
Entamoeba histolytica: NOT DETECTED
Enteroaggregative E coli: NOT DETECTED
Enteropathogenic E coli: DETECTED — AB
Enterotoxigenic E coli: NOT DETECTED
Giardia lamblia: NOT DETECTED
Norovirus GI/GII: NOT DETECTED
Plesiomonas shigelloides: DETECTED — AB
Rotavirus A: NOT DETECTED
Salmonella: NOT DETECTED
Sapovirus: NOT DETECTED
Shiga-toxin-producing E coli: NOT DETECTED
Shigella/Enteroinvasive E coli: NOT DETECTED
Vibrio cholerae: NOT DETECTED
Vibrio: NOT DETECTED
Yersinia enterocolitica: NOT DETECTED

## 2022-07-08 ENCOUNTER — Other Ambulatory Visit: Payer: Self-pay

## 2022-07-08 MED ORDER — MIRTAZAPINE 7.5 MG PO TABS: 7.5000 mg | ORAL_TABLET | Freq: Every day | ORAL | 0 refills | Status: DC

## 2022-09-15 DIAGNOSIS — L308 Other specified dermatitis: Secondary | ICD-10-CM | POA: Diagnosis not present

## 2022-09-15 DIAGNOSIS — L2089 Other atopic dermatitis: Secondary | ICD-10-CM | POA: Diagnosis not present

## 2023-04-05 ENCOUNTER — Encounter: Payer: Self-pay | Admitting: Emergency Medicine

## 2023-04-05 ENCOUNTER — Ambulatory Visit
Admission: EM | Admit: 2023-04-05 | Discharge: 2023-04-05 | Disposition: A | Discharge status: Home / Self Care | Payer: BC Managed Care – PPO | Attending: Nurse Practitioner | Admitting: Nurse Practitioner

## 2023-04-05 ENCOUNTER — Other Ambulatory Visit: Payer: Self-pay

## 2023-04-05 DIAGNOSIS — H6122 Impacted cerumen, left ear: Secondary | ICD-10-CM | POA: Diagnosis not present

## 2023-04-05 NOTE — ED Triage Notes (Signed)
Pt reports got water in left ear while in the shower x1 week ago. Pt reports increased pressure in bilateral ears ever since. Denies any known injury.

## 2023-04-05 NOTE — Discharge Instructions (Signed)
We flushed your left ear and removed all of the earwax today.  Please continue to avoid using Q-tips.  You can use over-the-counter Debrox drops to help with earwax buildup if needed.

## 2023-04-05 NOTE — ED Provider Notes (Signed)
[facility]    CSN: [id number] Arrival date & time: 04/05/23  1618      History   Chief Complaint Chief Complaint  Patient presents with   Ear Pain    HPI Wendy Lester is a 22 y.o. female.   Patient presents today with left ear clogged/plugged sensation, pressure in the ear, hearing loss from the ear, and slight ear pain.  She denies fever, ear drainage, recent cough, congestion, or sore throat.  Does not use Q-tips on a regular basis.  No history of ceruminosis.  Reports her mom tried over-the-counter alcohol eardrops without improvement.      Past Medical History:  Diagnosis Date   Hypothyroidism    Weight loss     Patient Active Problem List   Diagnosis Date Noted   Nausea vomiting and diarrhea 04/30/2022   Arterial hypotension 03/29/2022   Subclinical hypothyroidism 07/30/2021   Patient underweight 05/27/2021   Mild exercise-induced asthma 07/29/2015    Past Surgical History:  Procedure Laterality Date   ARTERY AND TENDON REPAIR Left    Radial artery repair   ARTERY EXPLORATION Left 05/23/2016   Procedure: EXPLORATION LEFT WRIST with REPAIR OF Radial ARTERY;  Surgeon: Sherren Kerns, MD;  Location: [facility] OR;  Service: Vascular;  Laterality: Left;   MULTIPLE TOOTH EXTRACTIONS     TENDON REPAIR Left 05/23/2016   Procedure: Flexor Carpi RadialisTENDON REPAIR;  Surgeon: Sherren Kerns, MD;  Location: [facility] OR;  Service: Vascular;  Laterality: Left;    OB History   No obstetric history on file.      Home Medications    Prior to Admission medications   Medication Sig Start Date End Date Taking? Authorizing Provider  acetaminophen (TYLENOL) 325 MG tablet Take 650 mg by mouth every 6 (six) hours as needed.    [provider]  mirtazapine (REMERON) 7.5 MG tablet Take 1 tablet (7.5 mg total) by mouth at bedtime. 07/08/22   Tommie Sams, DO  pantoprazole (PROTONIX) 40 MG tablet Take 1 tablet (40 mg total) by mouth daily. 04/29/22   Tommie Sams, DO  cetirizine (ZYRTEC CHILDRENS ALLERGY) 10 MG chewable tablet Chew 1 tablet (10 mg total) by mouth at bedtime. Patient not taking: Reported on 08/27/2019 10/03/13 03/10/21  Merlyn Albert, MD    Family History Family History  Problem Relation Age of Onset   Hypertension Mother    Hyperlipidemia Mother    Hyperlipidemia Father    Hypertension Father    Diabetes Father    Leukemia Brother     Social History Social History   Tobacco Use   Smoking status: Never   Smokeless tobacco: Never  Vaping Use   Vaping Use: Never used  Substance Use Topics   Alcohol use: Never   Drug use: Never     Allergies   Pork-derived products   Review of Systems Review of Systems Per HPI  Physical Exam Triage Vital Signs ED Triage Vitals  Enc Vitals Group     BP 04/05/23 1633 (!) 97/53     Pulse Rate 04/05/23 1633 79     Resp 04/05/23 1633 20     Temp 04/05/23 1633 98.3 F (36.8 C)     Temp Source 04/05/23 1633 Oral     SpO2 04/05/23 1633 98 %     Weight --      Height --      Head Circumference --      Peak Flow --  Pain Score 04/05/23 1632 0     Pain Loc --      Pain Edu? --      Excl. in GC? --    No data found.  Updated Vital Signs BP (!) 97/53 (BP Location: Right Arm) Comment: pt rpeorts history of low bp. nad noted.  Pulse 79   Temp 98.3 F (36.8 C) (Oral)   Resp 20   LMP 04/05/2023   SpO2 98%   Visual Acuity Right Eye Distance:   Left Eye Distance:   Bilateral Distance:    Right Eye Near:   Left Eye Near:    Bilateral Near:     Physical Exam Vitals and nursing note reviewed.  Constitutional:      General: She is not in acute distress.    Appearance: Normal appearance. She is not toxic-appearing.  HENT:     Head: Normocephalic and atraumatic.     Right Ear: Tympanic membrane, ear canal and external ear normal.     Left Ear: There is impacted cerumen.     Nose: Nose normal. No congestion or rhinorrhea.     Mouth/Throat:     Mouth: Mucous  membranes are moist.     Pharynx: Oropharynx is clear.  Pulmonary:     Effort: Pulmonary effort is normal. No respiratory distress.  Musculoskeletal:     Cervical back: Normal range of motion.  Lymphadenopathy:     Cervical: No cervical adenopathy.  Skin:    General: Skin is warm and dry.     Capillary Refill: Capillary refill takes less than 2 seconds.     Coloration: Skin is not jaundiced or pale.     Findings: No erythema.  Neurological:     Mental Status: She is alert and oriented to person, place, and time.  Psychiatric:        Behavior: Behavior is cooperative.      UC Treatments / Results  Labs (all labs ordered are listed, but only abnormal results are displayed) Labs Reviewed - No data to display  EKG   Radiology No results found.  Procedures Ear Cerumen Removal  Date/Time: 04/05/2023 5:01 PM  Performed by: Valentino Nose, NP Authorized by: Valentino Nose, NP   Consent:    Consent obtained:  Verbal   Consent given by:  Patient   Risks, benefits, and alternatives were discussed: yes     Risks discussed:  Dizziness, infection, incomplete removal, TM perforation and pain   Alternatives discussed:  Alternative treatment Universal protocol:    Procedure explained and questions answered to patient or proxy's satisfaction: yes     Patient identity confirmed:  Verbally with patient Procedure details:    Location:  L ear   Procedure type: irrigation     Procedure outcomes: cerumen removed   Post-procedure details:    Inspection:  TM intact and ear canal clear   Hearing quality:  Normal   Procedure completion:  Tolerated well, no immediate complications  (including critical care time)  Medications Ordered in UC Medications - No data to display  Initial Impression / Assessment and Plan / UC Course  I have reviewed the triage vital signs and the nursing notes.  Pertinent labs & imaging results that were available during my care of the patient were  reviewed by me and considered in my medical decision making (see chart for details).   Patient is well-appearing, afebrile, not tachycardic, not tachypneic, oxygenating well on room air.  Patient is borderline hypotensive today,  however reports this is near her baseline.  1.Impacted cerumen of left ear Left ear was irrigated as above Recommended use of Debrox eardrops to help thin earwax Seek care for persistent or worsening symptoms despite treatment  The patient was given the opportunity to ask questions.  All questions answered to their satisfaction.  The patient is in agreement to this plan.    Final Clinical Impressions(s) / UC Diagnoses   Final diagnoses:  Impacted cerumen of left ear     Discharge Instructions      We flushed your left ear and removed all of the earwax today.  Please continue to avoid using Q-tips.  You can use over-the-counter Debrox drops to help with earwax buildup if needed.     ED Prescriptions   None    PDMP not reviewed this encounter.   Valentino Nose, NP 04/05/23 [phone]

## 2023-06-20 ENCOUNTER — Ambulatory Visit: Payer: BC Managed Care – PPO | Admitting: Family Medicine

## 2023-06-20 DIAGNOSIS — M79671 Pain in right foot: Secondary | ICD-10-CM | POA: Diagnosis not present

## 2023-06-28 ENCOUNTER — Telehealth: Payer: Self-pay

## 2023-06-28 DIAGNOSIS — Z111 Encounter for screening for respiratory tuberculosis: Secondary | ICD-10-CM

## 2023-06-28 NOTE — Telephone Encounter (Signed)
Pt is calling needing a x-ray she had a TB test done three years ago that made her arm swollen and she had to go for a x-ray that come out normal. She was told from now on that she needed to have a x-ray instead of skin test. She is needing a TB test for a job now and needs a x-ray ordered.   [name]

## 2023-06-29 NOTE — Telephone Encounter (Signed)
Everlene Other G, DO     Please go ahead and order chest xray.  Thank you  Dr. Adriana Simas

## 2023-06-29 NOTE — Telephone Encounter (Signed)
Informed patient via MyChart

## 2023-07-03 ENCOUNTER — Telehealth: Payer: Self-pay

## 2023-07-03 ENCOUNTER — Ambulatory Visit (HOSPITAL_COMMUNITY)
Admission: RE | Admit: 2023-07-03 | Discharge: 2023-07-03 | Disposition: A | Discharge status: Home / Self Care | Payer: BC Managed Care – PPO | Source: Ambulatory Visit | Attending: Family Medicine | Admitting: Family Medicine

## 2023-07-03 DIAGNOSIS — Z111 Encounter for screening for respiratory tuberculosis: Secondary | ICD-10-CM | POA: Diagnosis not present

## 2023-07-03 DIAGNOSIS — Q676 Pectus excavatum: Secondary | ICD-10-CM | POA: Diagnosis not present

## 2023-07-03 NOTE — Telephone Encounter (Signed)
Patient called the office about TB xray and was informed that her Xray has been ordered.

## 2023-12-03 DIAGNOSIS — Z419 Encounter for procedure for purposes other than remedying health state, unspecified: Secondary | ICD-10-CM | POA: Diagnosis not present

## 2023-12-23 DIAGNOSIS — Z419 Encounter for procedure for purposes other than remedying health state, unspecified: Secondary | ICD-10-CM | POA: Diagnosis not present

## 2024-01-20 DIAGNOSIS — Z419 Encounter for procedure for purposes other than remedying health state, unspecified: Secondary | ICD-10-CM | POA: Diagnosis not present

## 2024-01-28 DIAGNOSIS — S8000XA Contusion of unspecified knee, initial encounter: Secondary | ICD-10-CM | POA: Diagnosis not present

## 2024-01-28 DIAGNOSIS — S8001XA Contusion of right knee, initial encounter: Secondary | ICD-10-CM | POA: Diagnosis not present

## 2024-01-28 DIAGNOSIS — Z681 Body mass index (BMI) 19 or less, adult: Secondary | ICD-10-CM | POA: Diagnosis not present

## 2024-01-28 DIAGNOSIS — S8002XA Contusion of left knee, initial encounter: Secondary | ICD-10-CM | POA: Diagnosis not present

## 2024-03-02 DIAGNOSIS — Z419 Encounter for procedure for purposes other than remedying health state, unspecified: Secondary | ICD-10-CM | POA: Diagnosis not present

## 2024-04-01 DIAGNOSIS — Z419 Encounter for procedure for purposes other than remedying health state, unspecified: Secondary | ICD-10-CM | POA: Diagnosis not present

## 2024-05-02 DIAGNOSIS — Z419 Encounter for procedure for purposes other than remedying health state, unspecified: Secondary | ICD-10-CM | POA: Diagnosis not present

## 2024-06-01 DIAGNOSIS — Z419 Encounter for procedure for purposes other than remedying health state, unspecified: Secondary | ICD-10-CM | POA: Diagnosis not present

## 2024-07-02 DIAGNOSIS — Z419 Encounter for procedure for purposes other than remedying health state, unspecified: Secondary | ICD-10-CM | POA: Diagnosis not present

## 2024-08-02 DIAGNOSIS — Z419 Encounter for procedure for purposes other than remedying health state, unspecified: Secondary | ICD-10-CM | POA: Diagnosis not present

## 2024-08-22 ENCOUNTER — Encounter: Payer: Self-pay | Admitting: Family Medicine

## 2024-08-28 ENCOUNTER — Encounter: Payer: Self-pay | Admitting: Family Medicine

## 2024-08-28 ENCOUNTER — Ambulatory Visit (INDEPENDENT_AMBULATORY_CARE_PROVIDER_SITE_OTHER): Payer: Self-pay | Admitting: Family Medicine

## 2024-08-28 VITALS — BP 104/73 | HR 92 | Ht 65.0 in | Wt 113.0 lb

## 2024-08-28 DIAGNOSIS — E038 Other specified hypothyroidism: Secondary | ICD-10-CM | POA: Diagnosis not present

## 2024-08-28 DIAGNOSIS — Z13220 Encounter for screening for lipoid disorders: Secondary | ICD-10-CM | POA: Diagnosis not present

## 2024-08-28 DIAGNOSIS — R55 Syncope and collapse: Secondary | ICD-10-CM

## 2024-08-28 DIAGNOSIS — Z0000 Encounter for general adult medical examination without abnormal findings: Secondary | ICD-10-CM | POA: Insufficient documentation

## 2024-08-28 DIAGNOSIS — Z8679 Personal history of other diseases of the circulatory system: Secondary | ICD-10-CM

## 2024-08-28 DIAGNOSIS — Z0001 Encounter for general adult medical examination with abnormal findings: Secondary | ICD-10-CM

## 2024-08-28 NOTE — Assessment & Plan Note (Signed)
 Labs today.  Declines immunizations.  Normal exam today.

## 2024-08-28 NOTE — Patient Instructions (Signed)
 Stay hydrated.  Regular meals.  Labs today.  Follow up annually   Take care  Dr. Bluford

## 2024-08-28 NOTE — Assessment & Plan Note (Signed)
 History consistent with vasovagal syncope.  Advised hydration and regular meals.  Labs today.

## 2024-08-28 NOTE — Progress Notes (Signed)
 Subjective:  Patient ID: Wendy Lester, female    DOB: 10/05/2001  Age: 23 y.o. MRN: [id number]  CC:   Chief Complaint  Patient presents with   Annual Exam   passed out    Passed out, dizzy with abdominal pain     HPI:  23 year old female presents for physical.  Patient is with her mother today.  She is here for an annual physical exam.  Declines immunizations today.  Is due for Pap smear.  Patient reports that her periods are improved.  She is in college and Scientist, research (medical).  Patient reports that last Monday she developed some abdominal pain and cramping and subsequent went to use the bathroom.  She states that she sat down on the toilet and was not using the bathroom and subsequently passed out and found herself on the floor.  Suspected vasovagal syncope.  Patient tries to stay well-hydrated.  She does not eat well/often per the mother.  Has a history of hypotension.  Patient Active Problem List   Diagnosis Date Noted   History of hypotension 08/28/2024   Vasovagal syncope 08/28/2024   Annual physical exam 08/28/2024   Subclinical hypothyroidism 07/30/2021   Mild exercise-induced asthma 07/29/2015    Social Hx   Social History   Socioeconomic History   Marital status: Single    Spouse name: Not on file   Number of children: Not on file   Years of education: Not on file   Highest education level: Not on file  Occupational History   Not on file  Tobacco Use   Smoking status: Never   Smokeless tobacco: Never  Vaping Use   Vaping status: Never Used  Substance and Sexual Activity   Alcohol use: Never   Drug use: Never   Sexual activity: Never  Other Topics Concern   Not on file  Social History Narrative   Lives with Mom, Dad, & Brother. 2 cats, 2 baby birds. No smokers.   Social Drivers of Corporate investment banker Strain: Not on file  Food Insecurity: Not on file  Transportation Needs: Not on file  Physical Activity: Not on file  Stress: Not on file   Social Connections: Not on file    Review of Systems Per HPI  Objective:  BP 104/73   Pulse 92   Ht 5' 5 (1.651 m)   Wt 113 lb (51.3 kg)   SpO2 98%   BMI 18.80 kg/m      08/28/2024    2:12 PM 04/05/2023    4:33 PM 04/29/2022    2:55 PM  BP/Weight  Systolic BP 104 97 96  Diastolic BP 73 53 66  Wt. (Lbs) 113  107  BMI 18.8 kg/m2  17.81 kg/m2    Physical Exam Vitals and nursing note reviewed.  Constitutional:      General: She is not in acute distress.    Appearance: Normal appearance.  HENT:     Head: Normocephalic and atraumatic.     Right Ear: Tympanic membrane normal.     Left Ear: Tympanic membrane normal.     Nose: Nose normal.     Mouth/Throat:     Pharynx: Oropharynx is clear.  Eyes:     General:        Right eye: No discharge.        Left eye: No discharge.     Conjunctiva/sclera: Conjunctivae normal.  Cardiovascular:     Rate and Rhythm: Normal rate and regular rhythm.  Pulmonary:  Effort: Pulmonary effort is normal.     Breath sounds: Normal breath sounds. No wheezing, rhonchi or rales.  Abdominal:     General: There is no distension.     Palpations: Abdomen is soft.     Tenderness: There is no abdominal tenderness.  Neurological:     General: No focal deficit present.     Mental Status: She is alert.  Psychiatric:        Mood and Affect: Mood normal.        Behavior: Behavior normal.     Lab Results  Component Value Date   WBC 6.5 04/29/2022   HGB 11.5 (L) 04/29/2022   HCT 36.0 04/29/2022   PLT 351 04/29/2022   GLUCOSE 102 (H) 04/29/2022   CHOL 175 (H) 05/31/2021   TRIG 96 (H) 05/31/2021   HDL 55 05/31/2021   LDLCALC 102 05/31/2021   ALT 11 04/29/2022   AST 13 (L) 04/29/2022   NA 137 04/29/2022   K 3.7 04/29/2022   CL 108 04/29/2022   CREATININE 0.61 04/29/2022   BUN 8 04/29/2022   CO2 22 04/29/2022   TSH 4.600 (H) 05/31/2021     Assessment & Plan:  Annual physical exam Assessment & Plan: Labs today.  Declines  immunizations.  Normal exam today.   Subclinical hypothyroidism -     TSH -     T3, free -     T4, free  Vasovagal syncope Assessment & Plan: History consistent with vasovagal syncope.  Advised hydration and regular meals.  Labs today.  Orders: -     CBC -     CMP14+EGFR  Screening, lipid -     Lipid panel  History of hypotension    Follow-up:  Annually  Nyema Hachey DO [facility]

## 2024-08-29 ENCOUNTER — Telehealth: Payer: Self-pay

## 2024-08-29 ENCOUNTER — Ambulatory Visit: Payer: Self-pay | Admitting: Family Medicine

## 2024-08-29 LAB — CBC
Hematocrit: 34.2 % (ref 34.0–46.6)
Hemoglobin: 10.9 g/dL — ABNORMAL LOW (ref 11.1–15.9)
MCH: 27.3 pg (ref 26.6–33.0)
MCHC: 31.9 g/dL (ref 31.5–35.7)
MCV: 86 fL (ref 79–97)
Platelets: 296 x10E3/uL (ref 150–450)
RBC: 3.99 x10E6/uL (ref 3.77–5.28)
RDW: 13.1 % (ref 11.7–15.4)
WBC: 5.3 x10E3/uL (ref 3.4–10.8)

## 2024-08-29 LAB — T4, FREE: Free T4: 1.16 ng/dL (ref 0.82–1.77)

## 2024-08-29 LAB — T3, FREE: T3, Free: 3.2 pg/mL (ref 2.0–4.4)

## 2024-08-29 LAB — CMP14+EGFR
ALT: 10 IU/L (ref 0–32)
AST: 13 IU/L (ref 0–40)
Albumin: 4.7 g/dL (ref 4.0–5.0)
Alkaline Phosphatase: 58 IU/L (ref 41–116)
BUN/Creatinine Ratio: 17 (ref 9–23)
BUN: 12 mg/dL (ref 6–20)
Bilirubin Total: 0.3 mg/dL (ref 0.0–1.2)
CO2: 20 mmol/L (ref 20–29)
Calcium: 9.8 mg/dL (ref 8.7–10.2)
Chloride: 104 mmol/L (ref 96–106)
Creatinine, Ser: 0.72 mg/dL (ref 0.57–1.00)
Globulin, Total: 2.9 g/dL (ref 1.5–4.5)
Glucose: 80 mg/dL (ref 70–99)
Potassium: 4.4 mmol/L (ref 3.5–5.2)
Sodium: 140 mmol/L (ref 134–144)
Total Protein: 7.6 g/dL (ref 6.0–8.5)
eGFR: 121 mL/min/1.73 (ref >59)

## 2024-08-29 LAB — TSH: TSH: 6.58 u[IU]/mL — ABNORMAL HIGH (ref 0.450–4.500)

## 2024-08-29 LAB — LIPID PANEL
Chol/HDL Ratio: 3.5 ratio (ref 0.0–4.4)
Cholesterol, Total: 191 mg/dL (ref 100–199)
HDL: 54 mg/dL (ref >39)
LDL Chol Calc (NIH): 122 mg/dL — ABNORMAL HIGH (ref 0–99)
Triglycerides: 82 mg/dL (ref 0–149)
VLDL Cholesterol Cal: 15 mg/dL (ref 5–40)

## 2024-08-29 NOTE — Telephone Encounter (Signed)
 Copied from CRM [id number]. Topic: Clinical - Lab/Test Results >> Aug 29, 2024  3:46 PM Ivette P wrote: Reason for CRM: pt called in about lab results from physical yesterday. Per results called [facility] to have nurse relay results to pt . Nurse not available.  Pls follow up with pt regarding abnormal results.

## 2024-08-30 ENCOUNTER — Telehealth: Payer: Self-pay | Admitting: Family Medicine

## 2024-08-30 NOTE — Telephone Encounter (Unsigned)
 Copied from CRM [id number]. Topic: Clinical - Lab/Test Results >> Aug 30, 2024  1:35 PM Tiffini S wrote: Reason for CRM: Patient is asking for a call back from a clinical nurse- was provided the lab results ans still have questions. She is asking if she has anemia and the numbers for her TSH levels.  Please call the patient back at [phone].

## 2024-08-30 NOTE — Telephone Encounter (Signed)
Duplicate- see other message

## 2024-08-30 NOTE — Telephone Encounter (Signed)
 Cook, Jayce G, DO       08/30/24  3:23 PM This was sent in [place].  Anemia noted. Recommend additional labs for further evaluation (Iron studies, B12, Folate).   TSH elevated. Normal T3 and T4.  This is consistent with subclinical hypothyroidism.  Do not recommend treatment at this time.  Recommend repeat thyroid testing at least once a

## 2024-09-01 DIAGNOSIS — Z419 Encounter for procedure for purposes other than remedying health state, unspecified: Secondary | ICD-10-CM | POA: Diagnosis not present

## 2024-09-03 ENCOUNTER — Other Ambulatory Visit: Payer: Self-pay

## 2024-09-03 DIAGNOSIS — D649 Anemia, unspecified: Secondary | ICD-10-CM

## 2024-09-03 NOTE — Telephone Encounter (Signed)
 I spoke with patient to inform her of her results , she is not currently able to get into her mychart account, labs ordered and informed patient.

## 2024-11-01 DIAGNOSIS — Z419 Encounter for procedure for purposes other than remedying health state, unspecified: Secondary | ICD-10-CM | POA: Diagnosis not present
